# Patient Record
Sex: Female | Born: 2000 | Race: White | Hispanic: Yes | Marital: Single | Smoking: Never smoker
Health system: Southern US, Community
[De-identification: ages and names within clinical notes are randomized; demographics above are authoritative.]

## PROBLEM LIST (undated history)

## (undated) DIAGNOSIS — B001 Herpesviral vesicular dermatitis: Secondary | ICD-10-CM

## (undated) DIAGNOSIS — J309 Allergic rhinitis, unspecified: Secondary | ICD-10-CM

## (undated) DIAGNOSIS — L509 Urticaria, unspecified: Secondary | ICD-10-CM

## (undated) DIAGNOSIS — E739 Lactose intolerance, unspecified: Secondary | ICD-10-CM

## (undated) DIAGNOSIS — H5789 Other specified disorders of eye and adnexa: Secondary | ICD-10-CM

## (undated) HISTORY — DX: Other specified disorders of eye and adnexa: H57.89

## (undated) HISTORY — DX: Urticaria, unspecified: L50.9

## (undated) HISTORY — PX: UPPER GI ENDOSCOPY: SHX6162

## (undated) HISTORY — DX: Lactose intolerance, unspecified: E73.9

## (undated) HISTORY — DX: Herpesviral vesicular dermatitis: B00.1

## (undated) HISTORY — DX: Allergic rhinitis, unspecified: J30.9

---
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
## 2020-01-06 ENCOUNTER — Other Ambulatory Visit: Payer: Self-pay

## 2020-01-06 MED ORDER — VALACYCLOVIR HCL 500 MG PO TABS: ORAL_TABLET | ORAL | 4 refills | Status: DC

## 2020-01-06 NOTE — Telephone Encounter (Signed)
Patient's mom called because her valacyclovir refill was only for 15 days. Per prescription details I have change dto allow fo rthe full 30 days.

## 2020-04-27 ENCOUNTER — Ambulatory Visit (INDEPENDENT_AMBULATORY_CARE_PROVIDER_SITE_OTHER): Payer: Medicaid Other | Admitting: Allergy and Immunology

## 2020-04-27 ENCOUNTER — Other Ambulatory Visit: Payer: Self-pay

## 2020-04-27 ENCOUNTER — Encounter: Payer: Self-pay | Admitting: Allergy and Immunology

## 2020-04-27 VITALS — BP 108/72 | HR 88 | Resp 16 | Ht 64.0 in | Wt 144.0 lb

## 2020-04-27 DIAGNOSIS — B001 Herpesviral vesicular dermatitis: Secondary | ICD-10-CM

## 2020-04-27 DIAGNOSIS — J3089 Other allergic rhinitis: Secondary | ICD-10-CM

## 2020-04-27 DIAGNOSIS — L989 Disorder of the skin and subcutaneous tissue, unspecified: Secondary | ICD-10-CM

## 2020-04-27 MED ORDER — PIMECROLIMUS 1 % EX CREA: TOPICAL_CREAM | CUTANEOUS | 5 refills | Status: AC

## 2020-04-27 MED ORDER — FEXOFENADINE HCL 180 MG PO TABS: 180.0000 mg | ORAL_TABLET | Freq: Every day | ORAL | 11 refills | Status: DC | PRN

## 2020-04-27 MED ORDER — VALACYCLOVIR HCL 500 MG PO TABS: ORAL_TABLET | ORAL | 4 refills | Status: DC

## 2020-04-27 MED ORDER — FLUTICASONE PROPIONATE 50 MCG/ACT NA SUSP: NASAL | 5 refills | Status: AC

## 2020-04-27 NOTE — Patient Instructions (Signed)
  1. Continue Valtrex 500 one tablet 3-7 times per week  2. Continue Elidel applied to face 1-7 times per week if needed.    3. Continue Flonase 1 spray each nostril 1-7 times per week if needed  4. Continue OTC antihistamine if needed  5. Return to clinic 12 months or earlier if problem  6. Recommend COVID vaccination series and flu vaccine

## 2020-04-27 NOTE — Progress Notes (Signed)
[place] - [place] - [place] - [place]   Follow-up Note  Referring Provider: Lise Auer, MD Primary Provider: Lise Auer, MD Date of Office Visit: 04/27/2020  Subjective:   Sandy Gardner (DOB: 04-07-2001) is a 20 y.o. female who [facility] 04/27/2020 in re-evaluation of the following:  HPI: Sandy Gardner returns to this clinic in reevaluation and allergic rhinitis and a history of inflammatory dermatosis involving her face and a history of herpes labialis treated with suppressive Valtrex.  I have not seen her in this clinic since 11 April 2018 but she did visit with our nurse practitioner on 14 June 2019 at which point time she was doing very well regarding each issue.  She continues to do very well.  She has very little issues with her airway while intermittently using some Flonase a few times per week and an over-the-counter antihistamine.  Her Valtrex utilized on a daily basis works very well in preventing her from developing recurrent fever blisters.  She rarely applies any Elidel to her face but when she does it seems to work quite well regarding her inflammatory dermatosis.  She has not received the Covid vaccine or the flu vaccine this year.  She is attending [employer] and is a sophomore at this point.  Allergies as of 04/27/2020      Reactions   Omnicef [cefdinir] Hives      Medication List      ALIGN PO Take 1 tablet by mouth daily.   fexofenadine 180 MG tablet Commonly known as: ALLEGRA Take 1 tablet (180 mg total) by mouth daily as needed for allergies or rhinitis.   fluticasone 50 MCG/ACT nasal spray Commonly known as: Flonase 1 spray in each nostril 1-7 times weekly.   multivitamin tablet Take 1 tablet by mouth daily.   pimecrolimus 1 % cream Commonly known as: ELIDEL Apply to face 3-7 times weekly.   valACYclovir 500 MG tablet Commonly known as: VALTREX TAKE 1 TABLET BY MOUTH 3 TO 7  TIMES PER WEEK   Vitamin D-3 125 MCG (5000 UT) Tabs Take 1 tablet by mouth daily.       Past Medical History:  Diagnosis Date  . Allergic rhinitis   . Eyes swollen   . Lactose intolerance   . Urticaria     Past Surgical History:  Procedure Laterality Date  . GROWTH PLATE SURGERY Left 2005   elbow  . UPPER GI ENDOSCOPY      Review of systems negative except as noted in HPI / PMHx or noted below:  Review of Systems  Constitutional: Negative.   HENT: Negative.   Eyes: Negative.   Respiratory: Negative.   Cardiovascular: Negative.   Gastrointestinal: Negative.   Genitourinary: Negative.   Musculoskeletal: Negative.   Skin: Negative.   Neurological: Negative.   Endo/Heme/Allergies: Negative.   Psychiatric/Behavioral: Negative.      Objective:   Vitals:   04/27/20 1506  BP: 108/72  Pulse: 88  Resp: 16  SpO2: 98%   Height: 5\' 4"  (162.6 cm)  Weight: 144 lb (65.3 kg)   Physical Exam Constitutional:      Appearance: She is not diaphoretic.  HENT:     Head: Normocephalic.     Right Ear: Tympanic membrane, ear canal and external ear normal.     Left Ear: Tympanic membrane, ear canal and external ear normal.     Nose: Nose normal. No mucosal edema or rhinorrhea.  Mouth/Throat:     Mouth: Oropharynx is clear and moist and mucous membranes are normal.     Pharynx: Uvula midline. No oropharyngeal exudate.  Eyes:     Conjunctiva/sclera: Conjunctivae normal.  Neck:     Thyroid: No thyromegaly.     Trachea: Trachea normal. No tracheal tenderness or tracheal deviation.  Cardiovascular:     Rate and Rhythm: Normal rate and regular rhythm.     Heart sounds: Normal heart sounds, S1 normal and S2 normal. No murmur heard.   Pulmonary:     Effort: No respiratory distress.     Breath sounds: Normal breath sounds. No stridor. No wheezing or rales.  Musculoskeletal:        General: No edema.  Lymphadenopathy:     Head:     Right side of head: No tonsillar  adenopathy.     Left side of head: No tonsillar adenopathy.     Cervical: No cervical adenopathy.  Skin:    Findings: No erythema or rash.     Nails: There is no clubbing.  Neurological:     Mental Status: She is alert.     Diagnostics: none  Assessment and Plan:   1. Perennial allergic rhinitis   2. Inflammatory dermatosis   3. Herpes labialis     1. Continue Valtrex 500 one tablet 3-7 times per week  2. Continue Elidel applied to face 1-7 times per week if needed.    3. Continue Flonase 1 spray each nostril 1-7 times per week if needed  4. Continue OTC antihistamine if needed  5. Return to clinic 12 months or earlier if problem  6. Recommend COVID vaccination series and flu vaccine  Sandy Gardner appears to be doing very well regarding each of her issues while she is using medical therapy on a pretty consistent basis to address her allergic rhinitis and her inflammatory dermatosis and her herpes labialis.  At this point she will continue on this plan and I will see her back in this clinic in 1 year or earlier if there is a problem.  Laurette Schimke, MD Allergy / Immunology [facility]

## 2020-04-28 ENCOUNTER — Encounter: Payer: Self-pay | Admitting: Allergy and Immunology

## 2020-05-25 ENCOUNTER — Other Ambulatory Visit: Payer: Self-pay

## 2020-05-25 ENCOUNTER — Telehealth: Payer: Self-pay | Admitting: Allergy and Immunology

## 2020-05-25 MED ORDER — VALACYCLOVIR HCL 500 MG PO TABS: ORAL_TABLET | ORAL | 4 refills | Status: DC

## 2020-05-25 NOTE — Telephone Encounter (Signed)
Abrea is at college and would like Valtrex sent to [facility] at [id number] [place], [place] [address].

## 2020-05-25 NOTE — Telephone Encounter (Signed)
Refill sent in and patient was notified 

## 2020-10-15 ENCOUNTER — Other Ambulatory Visit: Payer: Self-pay | Admitting: Allergy and Immunology

## 2020-11-19 DIAGNOSIS — I471 Supraventricular tachycardia: Secondary | ICD-10-CM | POA: Diagnosis not present

## 2020-11-19 DIAGNOSIS — G909 Disorder of the autonomic nervous system, unspecified: Secondary | ICD-10-CM | POA: Diagnosis not present

## 2020-11-20 ENCOUNTER — Other Ambulatory Visit: Payer: Self-pay

## 2020-11-20 ENCOUNTER — Encounter: Payer: Self-pay | Admitting: Cardiology

## 2020-11-20 ENCOUNTER — Ambulatory Visit (INDEPENDENT_AMBULATORY_CARE_PROVIDER_SITE_OTHER): Payer: Medicaid Other | Admitting: Cardiology

## 2020-11-20 VITALS — BP 86/68 | HR 82 | Ht 64.0 in | Wt 145.0 lb

## 2020-11-20 DIAGNOSIS — G909 Disorder of the autonomic nervous system, unspecified: Secondary | ICD-10-CM

## 2020-11-20 DIAGNOSIS — I371 Nonrheumatic pulmonary valve insufficiency: Secondary | ICD-10-CM | POA: Diagnosis not present

## 2020-11-20 DIAGNOSIS — I471 Supraventricular tachycardia: Secondary | ICD-10-CM | POA: Diagnosis not present

## 2020-11-20 NOTE — H&P (View-Only) (Signed)
Electrophysiology Office Note:    Date:  11/20/2020   ID:  Sandy Gardner, DOB Jan 30, 2001, MRN [id number]  PCP:  Sandy Auer, MD  [facility] Cardiologist:  None  [facility] Electrophysiologist:  None   Referring MD: Sandy Auer, MD   Chief Complaint: SVT  History of Present Illness:    Sandy Gardner is a 20 y.o. female who presents for an evaluation of SVT at the request of Dr Sandy Gardner. She has no significant past medical history. Her father has a history of palpitations, tachycardia and syncope. Sandy Gardner is with her mother during today's appointment. She presented to [facility] ED after experienced sudden onset palpitations. She tells me she has had at least 5 episodes of these racing heart beats and lightheadedness. No specific triggers. She had previously gone to [facility] but symptoms spontaneously resolved.   She was seen by Dr Sandy Gardner at [facility]. A beta blocker was offered alongside midodrine but [name] was reluctant to start and would like to pursue a treatment option that avoids long term medications.  She is with her mother today in clinic.   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   She has previously been prescribed midodrine by her PCP for hypotension. She feels lightheaded and dizzy at times when her Bps are low. This seems to be worse since her Covid infection earlier this year.   Past Medical History:  Diagnosis Date   Allergic rhinitis    Eyes swollen    Lactose intolerance    Urticaria     Past Surgical History:  Procedure Laterality Date   GROWTH PLATE SURGERY Left 2005   elbow   UPPER GI ENDOSCOPY      Current Medications: Current Meds  Medication Sig   Cholecalciferol (VITAMIN D-3) 5000 units TABS Take 1 tablet by mouth daily.   fexofenadine (ALLEGRA) 180 MG tablet Take 1 tablet (180 mg total) by mouth daily as needed for allergies or rhinitis.   fluticasone (FLONASE) 50 MCG/ACT nasal spray 1 spray  in each nostril 1-7 times weekly.   Multiple Vitamin (MULTIVITAMIN) tablet Take 1 tablet by mouth daily.   pimecrolimus (ELIDEL) 1 % cream Apply to face 3-7 times weekly.   Probiotic Product (ALIGN PO) Take 1 tablet by mouth daily.   valACYclovir (VALTREX) 500 MG tablet TAKE 1 TABLET BY MOUTH 3 TO 7 TIMES PER WEEK     Allergies:   Omnicef [cefdinir]   Social History   Socioeconomic History   Marital status: Single    Spouse name: Not on file   Number of children: Not on file   Years of education: Not on file   Highest education level: Not on file  Occupational History   Not on file  Tobacco Use   Smoking status: Never   Smokeless tobacco: Never  Vaping Use   Vaping Use: Never used  Substance and Sexual Activity   Alcohol use: No   Drug use: No   Sexual activity: Not on file  Other Topics Concern   Not on file  Social History Narrative   Not on file   Social Determinants of Health   Financial Resource Strain: Not on file  Food Insecurity: Not on file  Transportation Needs: Not on file  Physical Activity: Not on file  Stress: Not on file  Social Connections: Not on file     Family History: The patient's family history includes Asthma in her mother; Food Allergy in  her mother; Hypertension in her father.  ROS:   Please see the history of present illness.    All other systems reviewed and are negative.  EKGs/Labs/Other Studies Reviewed:    The following studies were reviewed today:  ECG #1 from [facility] health Narrow complex tachycardia with a ventricular rate of 250 bpm. There are retrograde P waves visible.   ECG # 2 from [facility] Sinus rhythm without evidence of preexcitation. No PVC. Intervals are normal.   EKG:  The ekg ordered today demonstrates normal sinus rhythm.  Recent Labs: No results found for requested labs within last 8760 hours.  Recent Lipid Panel No results found for: CHOL, TRIG, HDL, CHOLHDL, VLDL, LDLCALC, LDLDIRECT  Physical Exam:     VS:  BP (!) 86/68   Pulse 82   Ht 5\' 4"  (1.626 m)   Wt 145 lb (65.8 kg)   SpO2 98%   BMI 24.89 kg/m     Wt Readings from Last 3 Encounters:  11/20/20 145 lb (65.8 kg) (75 %, Z= 0.67)*  04/27/20 144 lb (65.3 kg) (75 %, Z= 0.69)*  04/11/18 153 lb 12.8 oz (69.8 kg) (88 %, Z= 1.16)*   * Growth percentiles are based on CDC (Girls, 2-20 Years) data.     GEN:  Well nourished, well developed in no acute distress HEENT: Normal NECK: No JVD; No carotid bruits LYMPHATICS: No lymphadenopathy CARDIAC: RRR, no murmurs, rubs, gallops RESPIRATORY:  Clear to auscultation without rales, wheezing or rhonchi  ABDOMEN: Soft, non-tender, non-distended MUSCULOSKELETAL:  No edema; No deformity  SKIN: Warm and dry NEUROLOGIC:  Alert and oriented x 3 PSYCHIATRIC:  Normal affect   ASSESSMENT:    1. SVT (supraventricular tachycardia) (HCC)    PLAN:    In order of problems listed above:  #SVT Narrow complex tachycardia.  Echo at [place] with normal EF. ECG during SVT suggests AVNRT although I cannot completely exclude AVRT or other SVT mechanisms. I discussed the treatment options with [name] and her mother during today's appointment. We could continue with a watchful waiting approach although I do not think is the best idea given she has experienced 5 episodes that are hemodynamically significant. Option 2 would be to use antiarrhythmic therapy but I also don't think this is a good idea given her relative hypotension and young age. Option 3 would be to pursue EP study and ablation. Given the possibility to achieve durable success without needing medications, I would favor this approach. [name] and her mother are both in agreement.  I discussed the risks, efficacy of EP study and ablation and she wishes to proceed.  I will plan to repeat the echo given image quality was limited at [facility].  Therapeutic strategies for supraventricular tachycardia including medicine and ablation were discussed  in detail with the patient today. Risk, benefits, and alternatives to EP study and radiofrequency ablation were also discussed in detail today. These risks include but are not limited to stroke, bleeding, vascular damage, tamponade, perforation, damage to the heart and other structures, AV block requiring pacemaker, worsening renal function, and death. The patient understands these risk and wishes to proceed.  We will therefore proceed with catheter ablation at the next available time.        Total time spent with patient today 65 minutes. This includes reviewing records, evaluating the patient and coordinating care.  Medication Adjustments/Labs and Tests Ordered: Current medicines are reviewed at length with the patient today.  Concerns regarding medicines are outlined above.  Orders Placed  This Encounter  Procedures   EKG 12-Lead   ECHOCARDIOGRAM COMPLETE   No orders of the defined types were placed in this encounter.    Signed, Rossie Muskrat. Lalla Brothers, MD, [facility], [facility] 11/20/2020 7:59 PM    Electrophysiology [facility]

## 2020-11-20 NOTE — Progress Notes (Signed)
Electrophysiology Office Note:    Date:  11/20/2020   ID:  Sandy Gardner, DOB Jan 30, 2001, MRN [id number]  PCP:  Lise Auer, MD  [facility] Cardiologist:  None  [facility] Electrophysiologist:  None   Referring MD: Lise Auer, MD   Chief Complaint: SVT  History of Present Illness:    Sandy Gardner is a 20 y.o. female who presents for an evaluation of SVT at the request of Dr Dulce Sellar. She has no significant past medical history. Her father has a history of palpitations, tachycardia and syncope. Sandy Gardner is with her mother during today's appointment. She presented to [facility] ED after experienced sudden onset palpitations. She tells me she has had at least 5 episodes of these racing heart beats and lightheadedness. No specific triggers. She had previously gone to [facility] but symptoms spontaneously resolved.   She was seen by Dr Dulce Sellar at [facility]. A beta blocker was offered alongside midodrine but Sandy Gardner was reluctant to start and would like to pursue a treatment option that avoids long term medications.  She is with her mother today in clinic.   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   She has previously been prescribed midodrine by her PCP for hypotension. She feels lightheaded and dizzy at times when her Bps are low. This seems to be worse since her Covid infection earlier this year.   Past Medical History:  Diagnosis Date   Allergic rhinitis    Eyes swollen    Lactose intolerance    Urticaria     Past Surgical History:  Procedure Laterality Date   GROWTH PLATE SURGERY Left 2005   elbow   UPPER GI ENDOSCOPY      Current Medications: Current Meds  Medication Sig   Cholecalciferol (VITAMIN D-3) 5000 units TABS Take 1 tablet by mouth daily.   fexofenadine (ALLEGRA) 180 MG tablet Take 1 tablet (180 mg total) by mouth daily as needed for allergies or rhinitis.   fluticasone (FLONASE) 50 MCG/ACT nasal spray 1 spray  in each nostril 1-7 times weekly.   Multiple Vitamin (MULTIVITAMIN) tablet Take 1 tablet by mouth daily.   pimecrolimus (ELIDEL) 1 % cream Apply to face 3-7 times weekly.   Probiotic Product (ALIGN PO) Take 1 tablet by mouth daily.   valACYclovir (VALTREX) 500 MG tablet TAKE 1 TABLET BY MOUTH 3 TO 7 TIMES PER WEEK     Allergies:   Omnicef [cefdinir]   Social History   Socioeconomic History   Marital status: Single    Spouse name: Not on file   Number of children: Not on file   Years of education: Not on file   Highest education level: Not on file  Occupational History   Not on file  Tobacco Use   Smoking status: Never   Smokeless tobacco: Never  Vaping Use   Vaping Use: Never used  Substance and Sexual Activity   Alcohol use: No   Drug use: No   Sexual activity: Not on file  Other Topics Concern   Not on file  Social History Narrative   Not on file   Social Determinants of Health   Financial Resource Strain: Not on file  Food Insecurity: Not on file  Transportation Needs: Not on file  Physical Activity: Not on file  Stress: Not on file  Social Connections: Not on file     Family History: The patient's family history includes Asthma in her mother; Food Allergy in  her mother; Hypertension in her father.  ROS:   Please see the history of present illness.    All other systems reviewed and are negative.  EKGs/Labs/Other Studies Reviewed:    The following studies were reviewed today:  ECG #1 from [facility] health Narrow complex tachycardia with a ventricular rate of 250 bpm. There are retrograde P waves visible.   ECG # 2 from [facility] Sinus rhythm without evidence of preexcitation. No PVC. Intervals are normal.   EKG:  The ekg ordered today demonstrates normal sinus rhythm.  Recent Labs: No results found for requested labs within last 8760 hours.  Recent Lipid Panel No results found for: CHOL, TRIG, HDL, CHOLHDL, VLDL, LDLCALC, LDLDIRECT  Physical Exam:     VS:  BP (!) 86/68   Pulse 82   Ht 5\' 4"  (1.626 m)   Wt 145 lb (65.8 kg)   SpO2 98%   BMI 24.89 kg/m     Wt Readings from Last 3 Encounters:  11/20/20 145 lb (65.8 kg) (75 %, Z= 0.67)*  04/27/20 144 lb (65.3 kg) (75 %, Z= 0.69)*  04/11/18 153 lb 12.8 oz (69.8 kg) (88 %, Z= 1.16)*   * Growth percentiles are based on CDC (Girls, 2-20 Years) data.     GEN:  Well nourished, well developed in no acute distress HEENT: Normal NECK: No JVD; No carotid bruits LYMPHATICS: No lymphadenopathy CARDIAC: RRR, no murmurs, rubs, gallops RESPIRATORY:  Clear to auscultation without rales, wheezing or rhonchi  ABDOMEN: Soft, non-tender, non-distended MUSCULOSKELETAL:  No edema; No deformity  SKIN: Warm and dry NEUROLOGIC:  Alert and oriented x 3 PSYCHIATRIC:  Normal affect   ASSESSMENT:    1. SVT (supraventricular tachycardia) (HCC)    PLAN:    In order of problems listed above:  #SVT Narrow complex tachycardia.  Echo at [place] with normal EF. ECG during SVT suggests AVNRT although I cannot completely exclude AVRT or other SVT mechanisms. I discussed the treatment options with Sandy Gardner and her mother during today's appointment. We could continue with a watchful waiting approach although I do not think is the best idea given she has experienced 5 episodes that are hemodynamically significant. Option 2 would be to use antiarrhythmic therapy but I also don't think this is a good idea given her relative hypotension and young age. Option 3 would be to pursue EP study and ablation. Given the possibility to achieve durable success without needing medications, I would favor this approach. Sandy Gardner and her mother are both in agreement.  I discussed the risks, efficacy of EP study and ablation and she wishes to proceed.  I will plan to repeat the echo given image quality was limited at [place].  Therapeutic strategies for supraventricular tachycardia including medicine and ablation were discussed  in detail with the patient today. Risk, benefits, and alternatives to EP study and radiofrequency ablation were also discussed in detail today. These risks include but are not limited to stroke, bleeding, vascular damage, tamponade, perforation, damage to the heart and other structures, AV block requiring pacemaker, worsening renal function, and death. The patient understands these risk and wishes to proceed.  We will therefore proceed with catheter ablation at the next available time.        Total time spent with patient today 65 minutes. This includes reviewing records, evaluating the patient and coordinating care.  Medication Adjustments/Labs and Tests Ordered: Current medicines are reviewed at length with the patient today.  Concerns regarding medicines are outlined above.  Orders Placed  This Encounter  Procedures   EKG 12-Lead   ECHOCARDIOGRAM COMPLETE   No orders of the defined types were placed in this encounter.    Signed, Rossie Muskrat. Lalla Brothers, MD, [facility], [facility] 11/20/2020 7:59 PM    Electrophysiology [facility]

## 2020-11-20 NOTE — Patient Instructions (Addendum)
Medication Instructions:  Your physician recommends that you continue on your current medications as directed. Please refer to the Current Medication list given to you today. *If you need a refill on your cardiac medications before your next appointment, please call your pharmacy*  Lab Work: None ordered. If you have labs (blood work) drawn today and your tests are completely normal, you will receive your results only by: MyChart Message (if you have MyChart) OR A paper copy in the mail If you have any lab test that is abnormal or we need to change your treatment, we will call you to review the results.  Testing/Procedures: Your physician has requested that you have an echocardiogram. Echocardiography is a painless test that uses sound waves to create images of your heart. It provides your doctor with information about the size and shape of your heart and how well your heart's chambers and valves are working. This procedure takes approximately one hour. There are no restrictions for this procedure.  Please schedule for ECHO- 11/23/2020 AT 11:00 AM  Follow-Up: At [facility], you and your health needs are our priority.  As part of our continuing mission to provide you with exceptional heart care, we have created designated Provider Care Teams.  These Care Teams include your primary Cardiologist (physician) and Advanced Practice Providers (APPs -  Physician Assistants and Nurse Practitioners) who all work together to provide you with the care you need, when you need it.  SEE INSTRUCTION LETTER  Cardiac electrophysiology: From cell to bedside (7th ed., pp. 8841-6606). [place], [place]: Elsevier.">  Cardiac Ablation Cardiac ablation is a procedure to destroy, or ablate, a small amount of heart tissue in very specific places. The heart has many electrical connections. Sometimes these connections are abnormal and can cause the heart to beat very fast or irregularly. Ablating some of the areas that  cause problems can improve the heart's rhythm or return it to normal. Ablation may be done for people who: Have Wolff-Parkinson-White syndrome. Have fast heart rhythms (tachycardia). Have taken medicines for an abnormal heart rhythm (arrhythmia) that were not effective or caused side effects. Have a high-risk heartbeat that may be life-threatening. During the procedure, a small incision is made in the neck or the groin, and a long, thin tube (catheter) is inserted into the incision and moved to the heart. Small devices (electrodes) on the tip of the catheter will send out electrical currents. A type of X-ray (fluoroscopy) will be used to help guide the catheter and to provide images of the heart. Tell a health care provider about: Any allergies you have. All medicines you are taking, including vitamins, herbs, eye drops, creams, and over-the-counter medicines. Any problems you or family members have had with anesthetic medicines. Any blood disorders you have. Any surgeries you have had. Any medical conditions you have, such as kidney failure. Whether you are pregnant or may be pregnant. What are the risks? Generally, this is a safe procedure. However, problems may occur, including: Infection. Bruising and bleeding at the catheter insertion site. Bleeding into the chest, especially into the sac that surrounds the heart. This is a serious complication. Stroke or blood clots. Damage to nearby structures or organs. Allergic reaction to medicines or dyes. Need for a permanent pacemaker if the normal electrical system is damaged. A pacemaker is a small computer that sends electrical signals to the heart and helps your heart beat normally. The procedure not being fully effective. This may not be recognized until months later. Repeat ablation  procedures are sometimes done. What happens before the procedure? Medicines Ask your health care provider about: Changing or stopping your regular medicines.  This is especially important if you are taking diabetes medicines or blood thinners. Taking medicines such as aspirin and ibuprofen. These medicines can thin your blood. Do not take these medicines unless your health care provider tells you to take them. Taking over-the-counter medicines, vitamins, herbs, and supplements. General instructions Follow instructions from your health care provider about eating or drinking restrictions. Plan to have someone take you home from the hospital or clinic. If you will be going home right after the procedure, plan to have someone with you for 24 hours. Ask your health care provider what steps will be taken to prevent infection. What happens during the procedure?  An IV will be inserted into one of your veins. You will be given a medicine to help you relax (sedative). The skin on your neck or groin will be numbed. An incision will be made in your neck or your groin. A needle will be inserted through the incision and into a large vein in your neck or groin. A catheter will be inserted into the needle and moved to your heart. Dye may be injected through the catheter to help your surgeon see the area of the heart that needs treatment. Electrical currents will be sent from the catheter to ablate heart tissue in desired areas. There are three types of energy that may be used to do this: Heat (radiofrequency energy). Laser energy. Extreme cold (cryoablation). When the tissue has been ablated, the catheter will be removed. Pressure will be held on the insertion area to prevent a lot of bleeding. A bandage (dressing) will be placed over the insertion area. The exact procedure may vary among health care providers and hospitals. What happens after the procedure? Your blood pressure, heart rate, breathing rate, and blood oxygen level will be monitored until you leave the hospital or clinic. Your insertion area will be monitored for bleeding. You will need to lie  still for a few hours to ensure that you do not bleed from the insertion area. Do not drive for 24 hours or as long as told by your health care provider. Summary Cardiac ablation is a procedure to destroy, or ablate, a small amount of heart tissue using an electrical current. This procedure can improve the heart rhythm or return it to normal. Tell your health care provider about any medical conditions you may have and all medicines you are taking to treat them. This is a safe procedure, but problems may occur. Problems may include infection, bruising, damage to nearby organs or structures, or allergic reactions to medicines. Follow your health care provider's instructions about eating and drinking before the procedure. You may also be told to change or stop some of your medicines. After the procedure, do not drive for 24 hours or as long as told by your health care provider. This information is not intended to replace advice given to you by your health care provider. Make sure you discuss any questions you have with your healthcare provider. Document Revised: 02/18/2019 Document Reviewed: 02/18/2019 Elsevier Patient Education  2022 [employer].

## 2020-11-23 ENCOUNTER — Other Ambulatory Visit: Payer: Self-pay

## 2020-11-23 ENCOUNTER — Ambulatory Visit (HOSPITAL_COMMUNITY): Payer: Medicaid Other | Attending: Internal Medicine

## 2020-11-23 DIAGNOSIS — I471 Supraventricular tachycardia: Secondary | ICD-10-CM | POA: Diagnosis present

## 2020-11-23 LAB — ECHOCARDIOGRAM COMPLETE
Area-P 1/2: 2.62 cm2
S' Lateral: 2.8 cm

## 2020-11-25 NOTE — Anesthesia Preprocedure Evaluation (Addendum)
Anesthesia Evaluation  Patient identified by MRN, date of birth, ID band Patient awake    Reviewed: Allergy & Precautions, NPO status , Patient's Chart, lab work & pertinent test results  Airway Mallampati: II  TM Distance: >3 FB Neck ROM: Full    Dental  (+) Teeth Intact   Pulmonary neg pulmonary ROS,    Pulmonary exam normal        Cardiovascular + dysrhythmias Supra Ventricular Tachycardia  Rhythm:Regular Rate:Normal     Neuro/Psych negative neurological ROS  negative psych ROS   GI/Hepatic negative GI ROS, Neg liver ROS,   Endo/Other  negative endocrine ROS  Renal/GU negative Renal ROS  negative genitourinary   Musculoskeletal negative musculoskeletal ROS (+)   Abdominal (+)  Abdomen: soft. Bowel sounds: normal.  Peds  Hematology negative hematology ROS (+)   Anesthesia Other Findings   Reproductive/Obstetrics                            Anesthesia Physical Anesthesia Plan  ASA: 2  Anesthesia Plan: MAC   Post-op Pain Management:    Induction: Intravenous  PONV Risk Score and Plan: 2 and Ondansetron, Dexamethasone, Treatment may vary due to age or medical condition, Propofol infusion and Midazolam  Airway Management Planned: Simple Face Mask, Natural Airway and Nasal Cannula  Additional Equipment: None  Intra-op Plan:   Post-operative Plan: Extubation in OR  Informed Consent: I have reviewed the patients History and Physical, chart, labs and discussed the procedure including the risks, benefits and alternatives for the proposed anesthesia with the patient or authorized representative who has indicated his/her understanding and acceptance.     Dental advisory given  Plan Discussed with: CRNA  Anesthesia Plan Comments: (Lab Results      Component                Value               Date                      PREGTESTUR               NEGATIVE            11/26/2020            Lab Results      Component                Value               Date                      WBC                      5.7                 11/26/2020                HGB                      13.2                11/26/2020                HCT                      39.8  11/26/2020                MCV                      104.7 (H)           11/26/2020                PLT                      177                 11/26/2020           Lab Results      Component                Value               Date                      NA                       135                 11/26/2020                K                        3.3 (L)             11/26/2020                CO2                      23                  11/26/2020                GLUCOSE                  92                  11/26/2020                BUN                      12                  11/26/2020                CREATININE               0.73                11/26/2020                CALCIUM                  9.3                 11/26/2020                GFRNONAA                 >60                 11/26/2020                GFRAA  CANCELED            10/21/2016           ECHO 11/23/20: IMPRESSIONS  1. Left ventricular ejection fraction, by estimation, is 60 to 65%. Left  ventricular ejection fraction by 3D volume is 58 %. The left ventricle has  normal function. The left ventricle has no regional wall motion  abnormalities. Left ventricular diastolic  parameters were normal.  2. Right ventricular systolic function is normal. The right ventricular  size is normal.  3. The mitral valve is normal in structure. No evidence of mitral valve  regurgitation. No evidence of mitral stenosis.  4. The aortic valve is tricuspid. Aortic valve regurgitation is not  visualized. No aortic stenosis is present. )     Anesthesia Quick Evaluation

## 2020-11-26 ENCOUNTER — Ambulatory Visit (HOSPITAL_COMMUNITY)
Admission: RE | Admit: 2020-11-26 | Discharge: 2020-11-26 | Disposition: A | Discharge status: Home / Self Care | Payer: Medicaid Other | Attending: Cardiology | Admitting: Cardiology

## 2020-11-26 ENCOUNTER — Encounter (HOSPITAL_COMMUNITY): Payer: Self-pay | Admitting: Cardiology

## 2020-11-26 ENCOUNTER — Other Ambulatory Visit: Payer: Self-pay

## 2020-11-26 ENCOUNTER — Ambulatory Visit (HOSPITAL_COMMUNITY): Payer: Medicaid Other | Admitting: Anesthesiology

## 2020-11-26 ENCOUNTER — Encounter (HOSPITAL_COMMUNITY)
Admission: RE | Disposition: A | Discharge status: Home / Self Care | Payer: Self-pay | Source: Home / Self Care | Attending: Cardiology

## 2020-11-26 DIAGNOSIS — Z8616 Personal history of COVID-19: Secondary | ICD-10-CM | POA: Diagnosis not present

## 2020-11-26 DIAGNOSIS — Z881 Allergy status to other antibiotic agents status: Secondary | ICD-10-CM | POA: Diagnosis not present

## 2020-11-26 DIAGNOSIS — Z8249 Family history of ischemic heart disease and other diseases of the circulatory system: Secondary | ICD-10-CM | POA: Insufficient documentation

## 2020-11-26 DIAGNOSIS — I471 Supraventricular tachycardia: Secondary | ICD-10-CM | POA: Insufficient documentation

## 2020-11-26 HISTORY — PX: SVT ABLATION: EP1225

## 2020-11-26 LAB — CBC
HCT: 39.8 % (ref 36.0–46.0)
Hemoglobin: 13.2 g/dL (ref 12.0–15.0)
MCH: 34.7 pg — ABNORMAL HIGH (ref 26.0–34.0)
MCHC: 33.2 g/dL (ref 30.0–36.0)
MCV: 104.7 fL — ABNORMAL HIGH (ref 80.0–100.0)
Platelets: 177 10*3/uL (ref 150–400)
RBC: 3.8 MIL/uL — ABNORMAL LOW (ref 3.87–5.11)
RDW: 11.7 % (ref 11.5–15.5)
WBC: 5.7 10*3/uL (ref 4.0–10.5)
nRBC: 0 % (ref 0.0–0.2)

## 2020-11-26 LAB — BASIC METABOLIC PANEL
Anion gap: 7 (ref 5–15)
BUN: 12 mg/dL (ref 6–20)
CO2: 23 mmol/L (ref 22–32)
Calcium: 9.3 mg/dL (ref 8.9–10.3)
Chloride: 105 mmol/L (ref 98–111)
Creatinine, Ser: 0.73 mg/dL (ref 0.44–1.00)
GFR, Estimated: >60 mL/min (ref >60)
Glucose, Bld: 92 mg/dL (ref 70–99)
Potassium: 3.3 mmol/L — ABNORMAL LOW (ref 3.5–5.1)
Sodium: 135 mmol/L (ref 135–145)

## 2020-11-26 LAB — PREGNANCY, URINE: Preg Test, Ur: NEGATIVE

## 2020-11-26 SURGERY — SVT ABLATION
Anesthesia: Monitor Anesthesia Care

## 2020-11-26 MED ORDER — ONDANSETRON HCL 4 MG/2ML IJ SOLN: 4.0000 mg | Freq: Four times a day (QID) | INTRAMUSCULAR | Status: DC | PRN

## 2020-11-26 MED ORDER — SODIUM CHLORIDE 0.9% FLUSH: 3.0000 mL | INTRAVENOUS | Status: DC | PRN

## 2020-11-26 MED ORDER — PHENYLEPHRINE HCL-NACL 20-0.9 MG/250ML-% IV SOLN
INTRAVENOUS | Status: DC | PRN
  Administered 2020-11-26: 25 ug/min via INTRAVENOUS

## 2020-11-26 MED ORDER — FENTANYL CITRATE (PF) 100 MCG/2ML IJ SOLN
INTRAMUSCULAR | Status: DC | PRN
  Administered 2020-11-26 (×3): 25 ug via INTRAVENOUS

## 2020-11-26 MED ORDER — PROPOFOL 500 MG/50ML IV EMUL
INTRAVENOUS | Status: DC | PRN
  Administered 2020-11-26: 125 ug/kg/min via INTRAVENOUS
  Administered 2020-11-26: 75 ug/kg/min via INTRAVENOUS

## 2020-11-26 MED ORDER — SODIUM CHLORIDE 0.9 % IV SOLN: 250.0000 mL | INTRAVENOUS | Status: DC | PRN

## 2020-11-26 MED ORDER — DEXAMETHASONE SODIUM PHOSPHATE 10 MG/ML IJ SOLN
INTRAMUSCULAR | Status: DC | PRN
  Administered 2020-11-26: 5 mg via INTRAVENOUS

## 2020-11-26 MED ORDER — ONDANSETRON HCL 4 MG/2ML IJ SOLN
INTRAMUSCULAR | Status: DC | PRN
  Administered 2020-11-26: 4 mg via INTRAVENOUS

## 2020-11-26 MED ORDER — ACETAMINOPHEN 325 MG PO TABS
650.0000 mg | ORAL_TABLET | ORAL | Status: DC | PRN
Start: 2020-11-26 — End: 2020-11-26
  Filled 2020-11-26: qty 2

## 2020-11-26 MED ORDER — MIDAZOLAM HCL 5 MG/5ML IJ SOLN
INTRAMUSCULAR | Status: DC | PRN
  Administered 2020-11-26: 2 mg via INTRAVENOUS

## 2020-11-26 MED ORDER — HEPARIN (PORCINE) IN NACL 1000-0.9 UT/500ML-% IV SOLN
INTRAVENOUS | Status: DC | PRN
  Administered 2020-11-26 (×2): 500 mL

## 2020-11-26 MED ORDER — ISOPROTERENOL HCL 0.2 MG/ML IJ SOLN
INTRAVENOUS | Status: DC | PRN
  Administered 2020-11-26: 2 ug/min via INTRAVENOUS

## 2020-11-26 MED ORDER — SODIUM CHLORIDE 0.9 % IV SOLN: INTRAVENOUS | Status: DC

## 2020-11-26 MED ORDER — BUPIVACAINE HCL (PF) 0.25 % IJ SOLN
INTRAMUSCULAR | Status: DC | PRN
  Administered 2020-11-26: 20 mL

## 2020-11-26 MED ORDER — PHENYLEPHRINE 40 MCG/ML (10ML) SYRINGE FOR IV PUSH (FOR BLOOD PRESSURE SUPPORT)
PREFILLED_SYRINGE | INTRAVENOUS | Status: DC | PRN
  Administered 2020-11-26: 40 ug via INTRAVENOUS
  Administered 2020-11-26: 80 ug via INTRAVENOUS

## 2020-11-26 MED ORDER — SODIUM CHLORIDE 0.9% FLUSH: 3.0000 mL | Freq: Two times a day (BID) | INTRAVENOUS | Status: DC

## 2020-11-26 MED ORDER — HEPARIN SODIUM (PORCINE) 1000 UNIT/ML IJ SOLN
INTRAMUSCULAR | Status: DC | PRN
  Administered 2020-11-26: 1000 [IU] via INTRAVENOUS

## 2020-11-26 MED ORDER — HEPARIN (PORCINE) IN NACL 1000-0.9 UT/500ML-% IV SOLN
INTRAVENOUS | Status: DC | PRN
  Administered 2020-11-26: 500 mL

## 2020-11-26 SURGICAL SUPPLY — 15 items
CATH CRD2 QUAD 6FR 5 (CATHETERS) ×1 IMPLANT
CATH DECANAV F CURVE (CATHETERS) ×1 IMPLANT
CATH JOSEPH QUAD ALLRED 6F REP (CATHETERS) ×1 IMPLANT
CATH SMTCH THERMOCOOL SF DF (CATHETERS) ×1 IMPLANT
CLOSURE PERCLOSE PROSTYLE (VASCULAR PRODUCTS) ×3 IMPLANT
PACK EP LATEX FREE (CUSTOM PROCEDURE TRAY) ×2
PACK EP LF (CUSTOM PROCEDURE TRAY) ×1 IMPLANT
PAD PRO RADIOLUCENT 2001M-C (PAD) ×2 IMPLANT
PATCH CARTO3 (PAD) ×1 IMPLANT
SHEATH CARTO VIZIGO SM CVD (SHEATH) ×1 IMPLANT
SHEATH PINNACLE 7F 10CM (SHEATH) ×1 IMPLANT
SHEATH PINNACLE 8F 10CM (SHEATH) ×2 IMPLANT
SHEATH PINNACLE 9F 10CM (SHEATH) ×1 IMPLANT
SHEATH PROBE COVER 6X72 (BAG) ×1 IMPLANT
TUBING SMART ABLATE COOLFLOW (TUBING) ×1 IMPLANT

## 2020-11-26 NOTE — Discharge Instructions (Addendum)
Post procedure care instructions No driving for 4 days. No lifting over 5 lbs for 1 week. No vigorous or sexual activity for 1 week. You may return to work/your usual activities on 12/04/20. Keep procedure site clean & dry. If you notice increased pain, swelling, bleeding or pus, call/return!  You may shower after 24 hours, but no soaking in baths/hot tubs/pools for 1 week.      Cardiac Ablation, Care After  This sheet gives you information about how to care for yourself after your procedure. Your health care provider may also give you more specific instructions. If you have problems or questions, contact your health care provider. What can I expect after the procedure? After the procedure, it is common to have: Bruising around your puncture site. Tenderness around your puncture site. Skipped heartbeats. Tiredness (fatigue).  Follow these instructions at home: Puncture site care  Follow instructions from your health care provider about how to take care of your puncture site. Make sure you: If present, leave stitches (sutures), skin glue, or adhesive strips in place. These skin closures may need to stay in place for up to 2 weeks. If adhesive strip edges start to loosen and curl up, you may trim the loose edges. Do not remove adhesive strips completely unless your health care provider tells you to do that. If a large square bandage is present, this may be removed 24 hours after surgery.  Check your puncture site every day for signs of infection. Check for: Redness, swelling, or pain. Fluid or blood. If your puncture site starts to bleed, lie down on your back, apply firm pressure to the area, and contact your health care provider. Warmth. Pus or a bad smell. Driving Do not drive for at least 4 days after your procedure or however long your health care provider recommends. (Do not resume driving if you have previously been instructed not to drive for other health reasons.) Do not drive or  use heavy machinery while taking prescription pain medicine. Activity Avoid activities that take a lot of effort for at least 7 days after your procedure. Do not lift anything that is heavier than 5 lb (4.5 kg) for one week.  No sexual activity for 1 week.  Return to your normal activities as told by your health care provider. Ask your health care provider what activities are safe for you. General instructions Take over-the-counter and prescription medicines only as told by your health care provider. Do not use any products that contain nicotine or tobacco, such as cigarettes and e-cigarettes. If you need help quitting, ask your health care provider. You may shower after 24 hours, but Do not take baths, swim, or use a hot tub for 1 week.  Do not drink alcohol for 24 hours after your procedure. Keep all follow-up visits as told by your health care provider. This is important. Contact a health care provider if: You have redness, mild swelling, or pain around your puncture site. You have fluid or blood coming from your puncture site that stops after applying firm pressure to the area. Your puncture site feels warm to the touch. You have pus or a bad smell coming from your puncture site. You have a fever. You have chest pain or discomfort that spreads to your neck, jaw, or arm. You are sweating a lot. You feel nauseous. You have a fast or irregular heartbeat. You have shortness of breath. You are dizzy or light-headed and feel the need to lie down. You have pain  or numbness in the arm or leg closest to your puncture site. Get help right away if: Your puncture site suddenly swells. Your puncture site is bleeding and the bleeding does not stop after applying firm pressure to the area. These symptoms may represent a serious problem that is an emergency. Do not wait to see if the symptoms will go away. Get medical help right away. Call your local emergency services (911 in the [place] Do not drive  yourself to the hospital. Summary After the procedure, it is normal to have bruising and tenderness at the puncture site in your groin, neck, or forearm. Check your puncture site every day for signs of infection. Get help right away if your puncture site is bleeding and the bleeding does not stop after applying firm pressure to the area. This is a medical emergency. This information is not intended to replace advice given to you by your health care provider. Make sure you discuss any questions you have with your health care provider.

## 2020-11-26 NOTE — Transfer of Care (Signed)
Immediate Anesthesia Transfer of Care Note  Patient: Sandy Gardner  Procedure(s) Performed: SVT ABLATION  Patient Location: Cath Lab  Anesthesia Type:MAC  Level of Consciousness: awake, alert  and oriented  Airway & Oxygen Therapy: Patient Spontanous Breathing  Post-op Assessment: Report given to RN, Post -op Vital signs reviewed and stable and Patient moving all extremities X 4  Post vital signs: Reviewed and stable  Last Vitals:  Vitals Value Taken Time  BP 100/55 11/26/20 1034  Temp    Pulse 84 11/26/20 1035  Resp 16 11/26/20 1035  SpO2 99 % 11/26/20 1035  Vitals shown include unvalidated device data.  Last Pain:  Vitals:   11/26/20 0559  TempSrc:   PainSc: 0-No pain         Complications: No notable events documented.

## 2020-11-26 NOTE — Interval H&P Note (Signed)
History and Physical Interval Note:  11/26/2020 7:18 AM  Sandy Gardner  has presented today for surgery, with the diagnosis of svt.  The various methods of treatment have been discussed with the patient and family. After consideration of risks, benefits and other options for treatment, the patient has consented to  Procedure(s): SVT ABLATION (N/A) as a surgical intervention.  The patient's history has been reviewed, patient examined, no change in status, stable for surgery.  I have reviewed the patient's chart and labs.  Questions were answered to the patient's satisfaction.     Malaia Buchta T Arman Loy

## 2020-11-26 NOTE — Anesthesia Procedure Notes (Signed)
Procedure Name: MAC Date/Time: 11/26/2020 7:35 AM Performed by: Harden Mo, CRNA Pre-anesthesia Checklist: Patient identified, Emergency Drugs available, Suction available and Patient being monitored Patient Re-evaluated:Patient Re-evaluated prior to induction Oxygen Delivery Method: Simple face mask Preoxygenation: Pre-oxygenation with 100% oxygen Induction Type: IV induction Placement Confirmation: positive ETCO2 and breath sounds checked- equal and bilateral Dental Injury: Teeth and Oropharynx as per pre-operative assessment

## 2020-11-26 NOTE — Anesthesia Postprocedure Evaluation (Signed)
Anesthesia Post Note  Patient: Sandy Gardner  Procedure(s) Performed: SVT ABLATION     Patient location during evaluation: PACU Anesthesia Type: MAC Level of consciousness: awake and alert Pain management: pain level controlled Vital Signs Assessment: post-procedure vital signs reviewed and stable Respiratory status: spontaneous breathing, nonlabored ventilation, respiratory function stable and patient connected to nasal cannula oxygen Cardiovascular status: stable and blood pressure returned to baseline Postop Assessment: no apparent nausea or vomiting Anesthetic complications: no   No notable events documented.  Last Vitals:  Vitals:   11/26/20 1330 11/26/20 1400  BP: (!) 96/52 (!) 100/59  Pulse: 91 87  Resp: 18 17  Temp:    SpO2: 98% 99%    Last Pain:  Vitals:   11/26/20 1120  TempSrc:   PainSc: 0-No pain                 Belenda Cruise P Corvin Sorbo

## 2020-12-22 ENCOUNTER — Other Ambulatory Visit: Payer: Self-pay

## 2020-12-22 ENCOUNTER — Ambulatory Visit (INDEPENDENT_AMBULATORY_CARE_PROVIDER_SITE_OTHER): Payer: Medicaid Other | Admitting: Cardiology

## 2020-12-22 ENCOUNTER — Encounter: Payer: Self-pay | Admitting: Cardiology

## 2020-12-22 VITALS — BP 110/64 | HR 81 | Ht 64.0 in | Wt 146.0 lb

## 2020-12-22 DIAGNOSIS — I471 Supraventricular tachycardia: Secondary | ICD-10-CM | POA: Diagnosis not present

## 2020-12-22 NOTE — Progress Notes (Signed)
Electrophysiology Office Follow up Visit Note:    Date:  12/22/2020   ID:  Sandy Gardner, DOB [birth year], MRN [id number]  PCP:  Lise Auer, MD   [facility] Electrophysiologist:  Lanier Prude, MD    Interval History:    Sandy Gardner is a 20 y.o. 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  She is done well since the ablation procedure.  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  She has had no recurrent arrhythmias.    Past Medical History:  Diagnosis Date   Allergic rhinitis    Eyes swollen    Lactose intolerance    Urticaria     Past Surgical History:  Procedure Laterality Date   GROWTH PLATE SURGERY Left 2005   elbow   SVT ABLATION N/A 11/26/2020   Procedure: SVT ABLATION;  Surgeon: Lanier Prude, MD;  Location: [facility] INVASIVE CV LAB;  Service: Cardiovascular;  Laterality: N/A;   UPPER GI ENDOSCOPY      Current Medications: Current Meds  Medication Sig   Cholecalciferol (VITAMIN D3) 50 MCG (2000 UT) capsule Take 2,000 Units by mouth daily.   fexofenadine (ALLEGRA) 180 MG tablet Take 1 tablet (180 mg total) by mouth daily as needed for allergies or rhinitis. (Patient taking differently: Take 180 mg by mouth daily.)   fluticasone (FLONASE) 50 MCG/ACT nasal spray 1 spray in each nostril 1-7 times weekly. (Patient taking differently: Place 2 sprays into both nostrils daily as needed for allergies.)   Multiple Vitamin (MULTIVITAMIN) tablet Take 1 tablet by mouth daily.   pimecrolimus (ELIDEL) 1 % cream Apply to face 3-7 times weekly. (Patient taking differently: Apply 1 application topically daily as needed (Rash on face). Apply to face 3-7 times weekly.)   Probiotic Product (ALIGN PO) Take 1 tablet by mouth daily.   valACYclovir (VALTREX) 500 MG tablet TAKE 1 TABLET BY MOUTH 3 TO 7 TIMES PER WEEK (Patient taking differently: Take 500  mg by mouth daily. TAKE 1 TABLET BY MOUTH 3 TO 7 TIMES PER WEEK)   vitamin B-12 (CYANOCOBALAMIN) 1000 MCG tablet Take 1,000 mcg by mouth daily.     Allergies:   Omnicef [cefdinir]   Social History   Socioeconomic History   Marital status: Single    Spouse name: Not on file   Number of children: Not on file   Years of education: Not on file   Highest education level: Not on file  Occupational History   Not on file  Tobacco Use   Smoking status: Never   Smokeless tobacco: Never  Vaping Use   Vaping Use: Never used  Substance and Sexual Activity   Alcohol use: No   Drug use: No   Sexual activity: Not on file  Other Topics Concern   Not on file  Social History Narrative   Not on file   Social Determinants of Health   Financial Resource Strain: Not on file  Food Insecurity: Not on file  Transportation Needs: Not on file  Physical Activity: Not on file  Stress: Not on file  Social Connections: Not on file     Family History: The patient's family history includes Asthma in her mother; Food Allergy in her mother; Hypertension in her father.  ROS:   Please see the history of present illness.    All other  systems reviewed and are negative.  EKGs/Labs/Other Studies Reviewed:    The following studies were reviewed today:  November 26, 2020 EP study and ablation CONCLUSIONS:  1. Sinus rhythm upon presentation.  2. 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 AV conduction appears nodal and excellent. 4. Successful radiofrequency ablation of the slow pathway region 5. No inducible arrhythmias following ablation.  6. No early apparent complications.    EKG:  The ekg ordered today demonstrates sinus rhythm.  No preexcitation.  Intervals normal.  Recent Labs: 11/26/2020: BUN 12; Creatinine, Ser 0.73; Hemoglobin 13.2; Platelets 177; Potassium 3.3; Sodium 135  Recent Lipid Panel No results found for: CHOL, TRIG, HDL, CHOLHDL, VLDL, LDLCALC, LDLDIRECT  Physical Exam:    VS:  BP  110/64   Pulse 81   Ht 5\' 4"  (1.626 m)   Wt 146 lb (66.2 kg)   LMP 11/26/2020 (Exact Date)   BMI 25.06 kg/m     Wt Readings from Last 3 Encounters:  12/22/20 146 lb (66.2 kg) (76 %, Z= 0.70)*  11/26/20 140 lb (63.5 kg) (69 %, Z= 0.49)*  11/20/20 145 lb (65.8 kg) (75 %, Z= 0.67)*   * Growth percentiles are based on CDC (Girls, 2-20 Years) data.     GEN:  Well nourished, well developed in no acute distress HEENT: Normal NECK: No JVD; No carotid bruits LYMPHATICS: No lymphadenopathy CARDIAC: RRR, no murmurs, rubs, gallops RESPIRATORY:  Clear to auscultation without rales, wheezing or rhonchi  ABDOMEN: Soft, non-tender, non-distended MUSCULOSKELETAL:  No edema; No deformity  SKIN: Warm and dry NEUROLOGIC:  Alert and oriented x 3 PSYCHIATRIC:  Normal affect   ASSESSMENT:    1. SVT (supraventricular tachycardia) (HCC)    PLAN:    In order of problems listed above:   #Typical AV nodal reentrant tachycardia Now post successful ablation of the slow pathway on November 26, 2020.  No recurrence of her arrhythmia.  She is back to normal activity levels without recurrence of arrhythmia.  Recommend follow-up as needed.  Did spend time today discussing the COVID-19 vaccine.  She is currently not vaccinated.        Total time spent with patient today 20 minutes. This includes reviewing records, evaluating the patient and coordinating care.   Medication Adjustments/Labs and Tests Ordered: Current medicines are reviewed at length with the patient today.  Concerns regarding medicines are outlined above.  Orders Placed This Encounter  Procedures   EKG 12-Lead   No orders of the defined types were placed in this encounter.    Signed, November 28, 2020, MD, [facility], [facility] 12/22/2020 1:23 PM    Electrophysiology [facility]

## 2020-12-22 NOTE — Patient Instructions (Addendum)
Medication Instructions:  Your physician recommends that you continue on your current medications as directed. Please refer to the Current Medication list given to you today.  Labwork: None ordered.  Testing/Procedures: None ordered.  Follow-Up: Your physician wants you to follow-up in: as needed.   Any Other Special Instructions Will Be Listed Below (If Applicable).  If you need a refill on your cardiac medications before your next appointment, please call your pharmacy.    

## 2021-03-30 ENCOUNTER — Other Ambulatory Visit: Payer: Self-pay

## 2021-03-30 ENCOUNTER — Encounter: Payer: Self-pay | Admitting: Allergy

## 2021-03-30 ENCOUNTER — Ambulatory Visit (INDEPENDENT_AMBULATORY_CARE_PROVIDER_SITE_OTHER): Payer: Medicaid Other | Admitting: Allergy

## 2021-03-30 VITALS — BP 108/62 | HR 83 | Resp 16

## 2021-03-30 DIAGNOSIS — B001 Herpesviral vesicular dermatitis: Secondary | ICD-10-CM | POA: Diagnosis not present

## 2021-03-30 DIAGNOSIS — J3089 Other allergic rhinitis: Secondary | ICD-10-CM

## 2021-03-30 DIAGNOSIS — L989 Disorder of the skin and subcutaneous tissue, unspecified: Secondary | ICD-10-CM | POA: Diagnosis not present

## 2021-03-30 MED ORDER — VALACYCLOVIR HCL 500 MG PO TABS: 500.0000 mg | ORAL_TABLET | Freq: Every day | ORAL | 1 refills | Status: DC

## 2021-03-30 NOTE — Progress Notes (Signed)
Follow-up Note  RE: Sandy Gardner MRN: [id number] DOB: 2000-08-27 Date of Office Visit: 03/30/2021   History of present illness: Sandy Gardner is a 20 y.o. female presenting today for follow-up of allergic rhinitis and inflammatory dermatosis.  She also has history of herpes labialis that she has taken valtrex for.  She was last seen in the office on 04/27/20 by Dr. Lucie Leather.    She states she has been doing good.  She states Allegra helps and will take it couple times a week as her maintenance.  She will use flonase couple a times a week like the Allegra for nasal symptom control.    Her main reason for the visit is to get a refill of Valtrex.  She states she takes valtrex pretty much daily to control her cold sores.  She took her last dose yesterday. She states prior if she didn't take her valtrex for 2-7 days she would have a return of cold sores.  She states has had lesions around her nose and mouth.    She also has elidel for facial dermatitis as needed.  States needed to use it over a month ago for facial rash.    Review of systems in the past 4 weeks: Review of Systems  Constitutional: Negative.   HENT: Negative.    Eyes: Negative.   Respiratory: Negative.    Cardiovascular: Negative.   Gastrointestinal: Negative.   Musculoskeletal: Negative.   Skin: Negative.   Allergic/Immunologic: Negative.   Neurological: Negative.     All other systems negative unless noted above in HPI  Past medical/social/surgical/family history have been reviewed and are unchanged unless specifically indicated below.  No changes  Medication List: Current Outpatient Medications  Medication Sig Dispense Refill   Cholecalciferol (VITAMIN D3) 50 MCG (2000 UT) capsule Take 2,000 Units by mouth daily.     fexofenadine (ALLEGRA) 180 MG tablet Take 1 tablet (180 mg total) by mouth daily as needed for allergies or rhinitis. (Patient taking differently: Take 180 mg by mouth daily.) 31 tablet 11    fluticasone (FLONASE) 50 MCG/ACT nasal spray 1 spray in each nostril 1-7 times weekly. (Patient taking differently: Place 2 sprays into both nostrils daily as needed for allergies.) 16 g 5   Multiple Vitamin (MULTIVITAMIN) tablet Take 1 tablet by mouth daily.     pimecrolimus (ELIDEL) 1 % cream Apply to face 3-7 times weekly. (Patient taking differently: Apply 1 application topically daily as needed (Rash on face). Apply to face 3-7 times weekly.) 60 g 5   Probiotic Product (ALIGN PO) Take 1 tablet by mouth daily.     vitamin B-12 (CYANOCOBALAMIN) 1000 MCG tablet Take 1,000 mcg by mouth daily.     valACYclovir (VALTREX) 500 MG tablet Take 1 tablet (500 mg total) by mouth daily. TAKE 1 TABLET BY MOUTH 3 TO 7 TIMES PER WEEK 90 tablet 1   No current facility-administered medications for this visit.     Known medication allergies: Allergies  Allergen Reactions   Omnicef [Cefdinir] Hives     Physical examination: Blood pressure 108/62, pulse 83, resp. rate 16, SpO2 98 %.  General: Alert, interactive, in no acute distress. HEENT: PERRLA, TMs pearly gray, turbinates non-edematous without discharge, post-pharynx non erythematous. Neck: Supple without lymphadenopathy. Lungs: Clear to auscultation without wheezing, rhonchi or rales. {no increased work of breathing. CV: Normal S1, S2 without murmurs. Abdomen: Nondistended, nontender. Skin: Warm and dry, without lesions or rashes. Extremities:  No clubbing, cyanosis or edema.  Neuro:   Grossly intact.  Diagnositics/Labs: None today  Assessment and plan: Perennial allergic rhinitis Inflammatory dermatosis Herpes labialis  At this time all of her symptoms seem to be controlled with her current regimen.  She has good response with use of her antihistamine and Flonase.  She has suppression of her herpes labialis with Valtrex use.  She also has resolution of dermatitis with Elidel.  We will continue her regimen as below   1. Continue Valtrex  500 one tablet 3-7 times per week  2. Continue Elidel applied to face 1-7 times per week if needed.    3. Continue Flonase 1 spray each nostril 1-7 times per week if needed  4. Continue OTC antihistamine if needed  5. Return to clinic 12 months or earlier if problem  6. Recommend COVID vaccination series and flu vaccine   I appreciate the opportunity to take part in [facility] care. Please do not hesitate to contact me with questions.  Sincerely,   Margo Aye, MD Allergy/Immunology [facility]

## 2021-03-30 NOTE — Patient Instructions (Signed)
  1. Continue Valtrex 500 one tablet 3-7 times per week  2. Continue Elidel applied to face 1-7 times per week if needed.    3. Continue Flonase 1 spray each nostril 1-7 times per week if needed  4. Continue OTC antihistamine if needed  5. Return to clinic 12 months or earlier if problem  6. Recommend COVID vaccination series and flu vaccine

## 2021-08-11 ENCOUNTER — Other Ambulatory Visit: Payer: Self-pay | Admitting: *Deleted

## 2021-08-11 ENCOUNTER — Telehealth: Payer: Self-pay | Admitting: Allergy

## 2021-08-11 MED ORDER — FEXOFENADINE HCL 180 MG PO TABS: ORAL_TABLET | ORAL | 7 refills | Status: DC

## 2021-08-11 NOTE — Telephone Encounter (Signed)
RX sent

## 2021-08-11 NOTE — Telephone Encounter (Signed)
Patient is requesting a refill on Allegra sent to [facility] in [place] on [phone]. ?

## 2021-09-27 ENCOUNTER — Telehealth: Payer: Self-pay | Admitting: Allergy and Immunology

## 2021-09-27 MED ORDER — VALACYCLOVIR HCL 500 MG PO TABS: ORAL_TABLET | ORAL | 3 refills | Status: DC

## 2021-09-27 NOTE — Telephone Encounter (Signed)
Patient is requesting a refill on [facility]altrex [facility] [address].

## 2021-09-27 NOTE — Telephone Encounter (Signed)
[facility] to [facility] on [address].

## 2021-11-08 ENCOUNTER — Encounter: Payer: Self-pay | Admitting: *Deleted

## 2021-11-08 ENCOUNTER — Encounter: Payer: Self-pay | Admitting: Cardiology

## 2021-11-08 ENCOUNTER — Telehealth: Payer: Self-pay | Admitting: Cardiology

## 2021-11-08 ENCOUNTER — Ambulatory Visit (INDEPENDENT_AMBULATORY_CARE_PROVIDER_SITE_OTHER): Payer: Medicaid Other

## 2021-11-08 DIAGNOSIS — I471 Supraventricular tachycardia: Secondary | ICD-10-CM

## 2021-11-08 NOTE — Telephone Encounter (Signed)
Spoke to the patient and her mother today. Per MD Lalla Brothers 14 day Zio to further evaluate the flutters she is feeling. Will follow up based on heart monitor. Verbalized understanding and agreement.

## 2021-11-08 NOTE — Telephone Encounter (Signed)
Patient c/o Palpitations:  High priority if patient c/o lightheadedness, shortness of breath, or chest pain  How long have you had palpitations/irregular HR/ Afib? Are you having the symptoms now? One week and then off one week, etc. Its been going on for about 6 months   Are you currently experiencing lightheadedness, SOB or CP? no  Do you have a history of afib (atrial fibrillation) or irregular heart rhythm? No   Have you checked your BP or HR? (document readings if available):  She states he bp is always low. Hr usually 90-100 and it might be in the 70s  Are you experiencing any other symptoms? Pt mothers states that pt has been experiencing heart flutters. She states that when it happens she just coughs and it will go away. She states pt does not have any other symptoms.

## 2021-11-08 NOTE — Progress Notes (Unsigned)
Enrolled for [employer] to mail a ZIO XT long term holter monitor to the patients address on file.  Letter with instructions mailed to patient. 

## 2021-11-16 DIAGNOSIS — I471 Supraventricular tachycardia: Secondary | ICD-10-CM | POA: Diagnosis not present

## 2022-02-15 ENCOUNTER — Other Ambulatory Visit: Payer: Self-pay | Admitting: *Deleted

## 2022-02-15 MED ORDER — VALACYCLOVIR HCL 500 MG PO TABS: ORAL_TABLET | ORAL | 3 refills | Status: DC

## 2022-02-16 ENCOUNTER — Other Ambulatory Visit: Payer: Self-pay | Admitting: *Deleted

## 2022-02-16 MED ORDER — VALACYCLOVIR HCL 500 MG PO TABS: ORAL_TABLET | ORAL | 3 refills | Status: DC

## 2022-06-06 ENCOUNTER — Ambulatory Visit (INDEPENDENT_AMBULATORY_CARE_PROVIDER_SITE_OTHER): Payer: Medicaid Other | Admitting: Allergy and Immunology

## 2022-06-06 ENCOUNTER — Encounter: Payer: Self-pay | Admitting: Allergy and Immunology

## 2022-06-06 VITALS — BP 100/60 | HR 86 | Resp 18

## 2022-06-06 DIAGNOSIS — L989 Disorder of the skin and subcutaneous tissue, unspecified: Secondary | ICD-10-CM | POA: Diagnosis not present

## 2022-06-06 DIAGNOSIS — J3089 Other allergic rhinitis: Secondary | ICD-10-CM

## 2022-06-06 DIAGNOSIS — B001 Herpesviral vesicular dermatitis: Secondary | ICD-10-CM

## 2022-06-06 MED ORDER — VALACYCLOVIR HCL 1 G PO TABS: ORAL_TABLET | ORAL | 11 refills | Status: DC

## 2022-06-06 NOTE — Progress Notes (Unsigned)
[facility]   Follow-up Note  Referring Provider: Mateo Flow, MD Primary Provider: Mateo Flow, MD Date of Office Visit: 06/06/2022  Subjective:   [facility] (DOB: 31-Dec-2000) is a 22 y.o. female who returns to the [facility] on 06/06/2022 in re-evaluation of the following:  HPI: [facility] returns to this clinic in evaluation of allergic rhinitis, herpes labialis, inflammatory dermatosis.  I last saw her in this clinic 27 April 2020 and she visited with Dr. Nelva Bush on 30 March 2021.  Her herpes labialis affecting her right upper lip and philtrum has become a little bit more active while using Valtrex 500 mg daily.  Certainly these lesions never get as red or as inflamed as they did in the past but they still appear to, Pinnell last about a week.  She usually has a solitary lesion.  Her allergic rhinitis has been under excellent control while intermittently using Flonase and an over-the-counter antihistamine, currently Allegra.  Her facial dermatitis appears to be under very Sandy control while using Elidel about 3 times per week usually to her eyelids and underneath her right lower lip.  She did visit with dermatology and they gave her various creams to use but she has the same result while using Elidel and she is very comfortable with using Elidel a few times a week.  She does not receive the flu vaccine.  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  Allergies as of 06/06/2022       Reactions   Omnicef [cefdinir] Hives        Medication List    ALIGN PO Take 1 tablet by mouth daily.   cyanocobalamin 1000 MCG tablet Commonly known as: VITAMIN B12 Take 1,000 mcg by mouth daily.   fexofenadine 180 MG tablet Commonly known as: ALLEGRA Take one tablet once daily   fluticasone 50 MCG/ACT nasal spray Commonly known as: Flonase 1 spray in  each nostril 1-7 times weekly.   Lo Loestrin Fe 1 MG-10 MCG / 10 MCG tablet Generic drug: Norethindrone-Ethinyl Estradiol-Fe Biphas Take 1 tablet by mouth as directed.   multivitamin tablet Take 1 tablet by mouth daily.   pimecrolimus 1 % cream Commonly known as: ELIDEL Apply to face 3-7 times weekly.   Vitamin D3 50 MCG (2000 UT) capsule Take 2,000 Units by mouth daily.    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   HENT: Negative.    Eyes: Negative.   Respiratory: Negative.    Cardiovascular: Negative.   Gastrointestinal: Negative.   Genitourinary: Negative.   Musculoskeletal: Negative.   Skin: Negative.   Neurological: Negative.   Endo/Heme/Allergies: Negative.   Psychiatric/Behavioral: Negative.       Objective:   Vitals:   06/06/22 1126  BP:  100/60  Pulse: 86  Resp: 18  SpO2: 99%          Physical Exam Constitutional:      Appearance: She is not diaphoretic.  HENT:     Head: Normocephalic.     Right Ear: Tympanic membrane, ear canal and external ear normal.     Left Ear: Tympanic membrane, ear canal and external ear normal.     Nose: Nose normal. No mucosal edema or rhinorrhea.     Mouth/Throat:     Pharynx: Uvula midline. No oropharyngeal exudate.  Eyes:     Conjunctiva/sclera: Conjunctivae normal.  Neck:     Thyroid: No thyromegaly.     Trachea: Trachea normal. No tracheal tenderness or tracheal deviation.  Cardiovascular:      Rate and Rhythm: Normal rate and regular rhythm.     Heart sounds: Normal heart sounds, S1 normal and S2 normal. No murmur heard. Pulmonary:     Effort: No respiratory distress.     Breath sounds: Normal breath sounds. No stridor. No wheezing or rales.  Lymphadenopathy:     Head:     Right side of head: No tonsillar adenopathy.     Left side of head: No tonsillar adenopathy.     Cervical: No cervical adenopathy.  Skin:    Findings: No erythema or rash.     Nails: There is no clubbing.  Neurological:     Mental Status: She is alert.     Diagnostics: none  Assessment and Plan:   1. Perennial allergic rhinitis   2. Inflammatory dermatosis   3. Herpes labialis    1. Increase Valtrex 1000 one tablet 3-7 times per week  2. Continue Elidel applied to face 1-7 times per week if needed.    3. Continue Flonase 1 spray each nostril 1-7 times per week if needed  4. Continue OTC antihistamine if needed  5. Return to clinic 12 months or earlier if problem  Carnell will increase her dose of Valtrex and she will let me know how this approach goes regarding control of her herpes labialis.  She has Sandy control of her inflammatory dermatosis with relatively infrequent use of Elidel and her allergic rhinitis is under Sandy control with her other medications as noted above.  Assuming she does well with this plan I will see her back in this clinic in 1 year or earlier if there is a problem.  Allena Katz, MD Allergy / Immunology [place]

## 2022-06-06 NOTE — Patient Instructions (Signed)
  1. Increase Valtrex 1000 one tablet 3-7 times per week  2. Continue Elidel applied to face 1-7 times per week if needed.    3. Continue Flonase 1 spray each nostril 1-7 times per week if needed  4. Continue OTC antihistamine if needed  5. Return to clinic 12 months or earlier if problem

## 2022-06-07 ENCOUNTER — Encounter: Payer: Self-pay | Admitting: Allergy and Immunology

## 2022-06-30 ENCOUNTER — Telehealth: Payer: Self-pay | Admitting: Allergy and Immunology

## 2022-06-30 NOTE — Telephone Encounter (Signed)
Patient states she was taking her Valtrex everyday but would  cause her bad headaches. She then started to space out when she would take it. She last took her medication on Sunday and has not had a headache or a cold sore pop up. She wants to know since it is a higher dose than usual is she able to wait a couple more days in between?

## 2022-07-01 NOTE — Telephone Encounter (Signed)
I called the patient to go over medication dosing and left a message for her to call the [facility] office back.

## 2022-07-04 NOTE — Telephone Encounter (Signed)
Patient returned call - DOB verified - advised of provider notation below.  Patient verbalized understanding, no further questions.

## 2023-07-06 ENCOUNTER — Emergency Department (HOSPITAL_COMMUNITY)
Admission: EM | Admit: 2023-07-06 | Discharge: 2023-07-06 | Disposition: A | Discharge status: Home / Self Care | Attending: Emergency Medicine | Admitting: Emergency Medicine

## 2023-07-06 ENCOUNTER — Encounter (HOSPITAL_COMMUNITY): Payer: Self-pay

## 2023-07-06 ENCOUNTER — Emergency Department (HOSPITAL_COMMUNITY)

## 2023-07-06 ENCOUNTER — Other Ambulatory Visit: Payer: Self-pay

## 2023-07-06 DIAGNOSIS — Z5321 Procedure and treatment not carried out due to patient leaving prior to being seen by health care provider: Secondary | ICD-10-CM | POA: Insufficient documentation

## 2023-07-06 DIAGNOSIS — R109 Unspecified abdominal pain: Secondary | ICD-10-CM | POA: Insufficient documentation

## 2023-07-06 LAB — HCG, SERUM, QUALITATIVE: Preg, Serum: NEGATIVE

## 2023-07-06 LAB — BASIC METABOLIC PANEL
Anion gap: 11 (ref 5–15)
BUN: 7 mg/dL (ref 6–20)
CO2: 19 mmol/L — ABNORMAL LOW (ref 22–32)
Calcium: 9.6 mg/dL (ref 8.9–10.3)
Chloride: 108 mmol/L (ref 98–111)
Creatinine, Ser: 0.66 mg/dL (ref 0.44–1.00)
GFR, Estimated: >60 mL/min (ref >60)
Glucose, Bld: 84 mg/dL (ref 70–99)
Potassium: 4.3 mmol/L (ref 3.5–5.1)
Sodium: 138 mmol/L (ref 135–145)

## 2023-07-06 LAB — TROPONIN I (HIGH SENSITIVITY)
Troponin I (High Sensitivity): <2 ng/L (ref <18)
Troponin I (High Sensitivity): 3 ng/L (ref <18)

## 2023-07-06 LAB — CBC
HCT: 43.6 % (ref 36.0–46.0)
Hemoglobin: 14.6 g/dL (ref 12.0–15.0)
MCH: 33.7 pg (ref 26.0–34.0)
MCHC: 33.5 g/dL (ref 30.0–36.0)
MCV: 100.7 fL — ABNORMAL HIGH (ref 80.0–100.0)
Platelets: 230 10*3/uL (ref 150–400)
RBC: 4.33 MIL/uL (ref 3.87–5.11)
RDW: 11.9 % (ref 11.5–15.5)
WBC: 10.4 10*3/uL (ref 4.0–10.5)
nRBC: 0 % (ref 0.0–0.2)

## 2023-07-06 LAB — LIPASE, BLOOD: Lipase: 34 U/L (ref 11–51)

## 2023-07-06 NOTE — ED Notes (Signed)
 Pt stated she was felling better. Pt advised she had a bed ready and will be seen shortly however, pt and family choose to leave.

## 2023-07-06 NOTE — ED Triage Notes (Signed)
 Pt reports intermittent "all over" sharp, stabbing abdominal pain that started today around 1230 that she describes as abdominal cramping. She has hx of GI issues and has not seen a GI doctor yet.

## 2023-07-06 NOTE — ED Provider Triage Note (Signed)
 Emergency Medicine Provider Triage Evaluation Note  Sandy [facility] , a 23 y.o. female  was evaluated in triage.  Pt complains of abdominal pain.  Patient reports sharp pains that began around noon today and resolved while in the lobby.  States that she has had a history of abdominal issues in the past for which she supposed to see GI for the near future.  States this is the worst pain that she is ever had.  Denies any associated changes to bowel habits, dysuria, nausea, vomiting, or fevers.  No new foods prior to onset of symptoms.  Review of Systems  Positive: Abdominal pain Negative: Nausea, vomiting, diarrhea, or fevers  Physical Exam  BP 114/71 (BP Location: Right Arm)   Pulse (!) 107   Temp 98.5 F (36.9 C)   Resp 18   Ht 5\' 4"  (1.626 m)   Wt 66.2 kg   LMP 07/06/2022   SpO2 100%   BMI 25.06 kg/m  Gen:   Awake, no distress   Resp:  Normal effort  MSK:   Moves extremities without difficulty  Other:    Medical Decision Making  Medically screening exam initiated at 4:33 PM.  Appropriate orders placed.  KEIONDRA BROOKOVER was informed that the remainder of the evaluation will be completed by another provider, this initial triage assessment does not replace that evaluation, and the importance of remaining in the ED until their evaluation is complete.   Maxwell Marion, PA-C 07/06/23 1635

## 2023-08-22 ENCOUNTER — Telehealth: Payer: Self-pay | Admitting: Allergy and Immunology

## 2023-08-22 NOTE — Telephone Encounter (Signed)
 Patient is requesting a courtesy refill on Valtrex  to [facility] in [place].

## 2023-08-28 ENCOUNTER — Ambulatory Visit: Admitting: Physician Assistant

## 2023-08-29 ENCOUNTER — Other Ambulatory Visit: Payer: Self-pay | Admitting: *Deleted

## 2023-08-29 MED ORDER — VALACYCLOVIR HCL 1 G PO TABS: ORAL_TABLET | ORAL | 0 refills | Status: DC

## 2023-08-29 NOTE — Telephone Encounter (Signed)
 RX sent by [facility].

## 2023-09-07 ENCOUNTER — Encounter: Payer: Self-pay | Admitting: Allergy and Immunology

## 2023-09-07 ENCOUNTER — Ambulatory Visit (INDEPENDENT_AMBULATORY_CARE_PROVIDER_SITE_OTHER): Admitting: Allergy and Immunology

## 2023-09-07 VITALS — BP 102/52 | HR 80 | Resp 16 | Ht 64.4 in | Wt 139.2 lb

## 2023-09-07 DIAGNOSIS — J3089 Other allergic rhinitis: Secondary | ICD-10-CM

## 2023-09-07 DIAGNOSIS — L989 Disorder of the skin and subcutaneous tissue, unspecified: Secondary | ICD-10-CM | POA: Diagnosis not present

## 2023-09-07 DIAGNOSIS — B001 Herpesviral vesicular dermatitis: Secondary | ICD-10-CM | POA: Diagnosis not present

## 2023-09-07 MED ORDER — VALACYCLOVIR HCL 1 G PO TABS: ORAL_TABLET | ORAL | 11 refills | Status: AC

## 2023-09-07 MED ORDER — FEXOFENADINE HCL 180 MG PO TABS: ORAL_TABLET | ORAL | 11 refills | Status: AC

## 2023-09-07 NOTE — Patient Instructions (Signed)
  1. Continue Valtrex  1000 one tablet 3-7 times per week  2. Continue Elidel  applied to face 1-7 times per week if needed.    3. Continue Flonase  1 spray each nostril 1-7 times per week if needed  4. Continue allegra  180 - 1 tablet 1 time per day if needed  5. Return to clinic 12 months or earlier if problem  6. Influenza = Tamiflu. Covid = Paxlovid

## 2023-09-07 NOTE — Progress Notes (Signed)
 [facility] [place] [facility] [place] [facility] [place] - [facility]   Follow-up Note  Referring Provider: Beecher Bower, MD Primary Provider: Beecher Bower, MD Date of Office Visit: 09/07/2023  Subjective:   [facility] (DOB: May 06, 2000) is a 23 y.o. female who [facility] in re-evaluation of the following:  HPI: [name] returns to this clinic in evaluation of allergic rhinitis, herpes labialis, and history of inflammatory dermatosis.  I last saw in this clinic 06 June 2022.  She has done very well with her herpes labialis while consistently using Valtrex .  She alters the dose of Valtrex  between 500 mg/day to 1000 mg/day.  Her facial dermatitis is under excellent control with some occasional Elidel .  She has had very little problems with her upper airways while using some Flonase  and over-the-counter Allegra .  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  Allergies as of 09/07/2023       Reactions   Omnicef [cefdinir] Hives        Medication List    ALIGN PO Take 1 tablet by mouth daily.   famotidine 20 MG tablet Commonly known as: PEPCID Take 20 mg by mouth 2 (two) times daily as needed.   fexofenadine  180 MG tablet Commonly known as: ALLEGRA  Can take one tablet by mouth once daily if needed.   fluticasone  50 MCG/ACT nasal spray Commonly known as: Flonase  1 spray in each nostril 1-7 times weekly.   ketoconazole 2 % shampoo Commonly known as: NIZORAL SMARTSIG:Topical 2-3 Times Weekly   Lo Loestrin Fe 1 MG-10 MCG / 10 MCG tablet Generic drug: Norethindrone-Ethinyl Estradiol-Fe Biphas Take 1 tablet by mouth as directed.   multivitamin tablet Take 1 tablet by mouth daily.   pimecrolimus  1 % cream Commonly known as: ELIDEL  Apply to face 3-7 times weekly.   valACYclovir  1000 MG tablet Commonly known as: Valtrex  Take one tablet three to seven times per week as directed.   Vitamin D3 50 MCG  (2000 UT) capsule Take 2,000 Units by mouth daily.    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   HENT: Negative.    Eyes: Negative.   Respiratory: Negative.    Cardiovascular: Negative.   Gastrointestinal: Negative.   Genitourinary: Negative.   Musculoskeletal: Negative.   Skin: Negative.   Neurological: Negative.   Endo/Heme/Allergies: Negative.   Psychiatric/Behavioral: Negative.       Objective:   Vitals:   09/07/23 1504  BP: (!) 102/52  Pulse: 80  Resp: 16  SpO2: 98%   Height: 5' 4.4" (163.6 cm)  Weight: 139 lb 3.2 oz (63.1 kg)   Physical Exam Constitutional:      Appearance: She is not diaphoretic.  HENT:     Head: Normocephalic.     Right Ear: Tympanic membrane, ear canal and external ear  normal.     Left Ear: Tympanic membrane, ear canal and external ear normal.     Nose: Nose normal. No mucosal edema or rhinorrhea.     Mouth/Throat:     Pharynx: Uvula midline. No oropharyngeal exudate.  Eyes:     Conjunctiva/sclera: Conjunctivae normal.  Neck:     Thyroid: No thyromegaly.     Trachea: Trachea normal. No tracheal tenderness or tracheal deviation.  Cardiovascular:     Rate and Rhythm: Normal rate and regular rhythm.     Heart sounds: Normal heart sounds, S1 normal and S2 normal. No murmur heard. Pulmonary:     Effort: No respiratory distress.     Breath sounds: Normal breath sounds. No stridor. No wheezing or rales.   Lymphadenopathy:     Head:     Right side of head: No tonsillar adenopathy.     Left side of head: No tonsillar adenopathy.     Cervical: No cervical adenopathy.  Skin:    Findings: No erythema or rash.     Nails: There is no clubbing.  Neurological:     Mental Status: She is alert.     Diagnostics: none  Assessment and Plan:   1. Perennial allergic rhinitis   2. Inflammatory dermatosis   3. Herpes labialis    1. Continue Valtrex  1000 one tablet 3-7 times per week  2. Continue Elidel  applied to face 1-7 times per week if needed.    3. Continue Flonase  1 spray each nostril 1-7 times per week if needed  4. Continue allegra  180 - 1 tablet 1 time per day if needed  5. Return to clinic 12 months or earlier if problem  6. Influenza = Tamiflu. Covid = Paxlovid  Hatice appears to be doing quite well on her current plan and she has a very good understanding of her disease states and how her medications work and appropriate dosing of her medications depending on disease activity.  Assuming she does well with the plan noted above I will see her back in this clinic in 1 year or earlier if there is a problem.   Schuyler Custard, MD Allergy / Immunology [facility]

## 2023-09-08 ENCOUNTER — Telehealth: Payer: Self-pay

## 2023-09-08 NOTE — Telephone Encounter (Signed)
*  Asthma/Allergy  Pharmacy Patient Advocate Encounter   Received notification from CoverMyMeds that prior authorization for Fexofenadine  HCl 180MG  tablets  is required/requested.   Insurance verification completed.   The patient is insured through [facility] .   Per test claim: PA required; PA submitted to above mentioned insurance via CoverMyMeds Key/confirmation #/EOC [id number] Status is pending

## 2023-09-08 NOTE — Telephone Encounter (Signed)
 Denied. Per the health plan's preferred drug list, at least 2 preferred drugs must be tried before requesting this drug or tell us  why the member cannot try any preferred alternatives. Please send us  supporting chart notes and lab results. Here is list of preferred alternatives: cetirizine OTC syrup 1mg /52ml, cetirizine Rx syrup, cetirizine tablets OTC, levocetirizine OTC tablet, levocetirizine Rx tablet, loratadine tablet OTC. Note: Some preferred drug(s) may have quantity limits. Refer to the health plan's preferred drug list for additional details.

## 2023-09-11 ENCOUNTER — Encounter: Payer: Self-pay | Admitting: Allergy and Immunology

## 2023-09-11 NOTE — Telephone Encounter (Signed)
 Left voicemail for patient to return call.

## 2023-09-21 NOTE — Telephone Encounter (Signed)
 My Chart message sent

## 2023-10-18 ENCOUNTER — Ambulatory Visit: Admitting: Physician Assistant

## 2023-11-28 ENCOUNTER — Encounter: Payer: Self-pay | Admitting: Gastroenterology

## 2023-11-28 ENCOUNTER — Ambulatory Visit (INDEPENDENT_AMBULATORY_CARE_PROVIDER_SITE_OTHER): Admitting: Gastroenterology

## 2023-11-28 ENCOUNTER — Other Ambulatory Visit (INDEPENDENT_AMBULATORY_CARE_PROVIDER_SITE_OTHER)

## 2023-11-28 VITALS — BP 104/70 | HR 68 | Ht 64.4 in | Wt 141.0 lb

## 2023-11-28 DIAGNOSIS — R110 Nausea: Secondary | ICD-10-CM | POA: Diagnosis not present

## 2023-11-28 DIAGNOSIS — R140 Abdominal distension (gaseous): Secondary | ICD-10-CM | POA: Diagnosis not present

## 2023-11-28 DIAGNOSIS — R195 Other fecal abnormalities: Secondary | ICD-10-CM | POA: Diagnosis not present

## 2023-11-28 DIAGNOSIS — R1084 Generalized abdominal pain: Secondary | ICD-10-CM

## 2023-11-28 NOTE — Progress Notes (Signed)
 Chief Complaint: Abdominal pain, nausea   Referring Provider:     Fernand Tracey LABOR, MD   HPI:     Sandy [facility] is a 23 y.o. female with a history of allergic rhinitis, inflammatory dermatosis, referred to the [facility] for evaluation of abdominal pain and nausea.  She reports that she has always had stomach problems.  Was diagnosed with lactose intolerance in childhood and has avoided lactose containing foods since then.  Has had worsening abdominal symptoms over the last 6 months or so.  Reports nausea every morning after eating breakfast.  Also with abdominal bloating and visible abdominal distention where she feels like a rock in her abdomen.  2 separate episodes of severe pain across upper abdomen, described as a cramping pain lasting a few seconds but repeating over several hours.  This happened once in March and she was seen in the ER with essentially normal labs and discharged home.  Had another episode last week.  Separately, sometimes has throat noises.  Not heartburn or regurgitation per se. Uses Tums and famotidine at times.   2 weeks ago had 2-3 episodes of black stools. No BRBPR. No diarrhea, constipation.   Reports celiac serologies by PCP at some point in the past with abnormal results.  Results not available for review.  Reviewed labs from 06/2023 ER visit: Normal CBC (MCV 100.7), normal BMP (CO2 19), normal lipase.  No recent abdominal imaging for review.  No previous EGD or colonoscopy.  No known family history of CRC, GI malignancy, liver disease, pancreatic disease, or IBD.     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   Lactose intolerance    Urticaria      Past Surgical History:  Procedure Laterality Date   GROWTH PLATE SURGERY Left 2005   elbow   SVT ABLATION N/A 11/26/2020   Procedure: SVT ABLATION;  Surgeon: Cindie Ole DASEN, MD;  [facility];  Service: Cardiovascular;  Laterality: N/A;   UPPER GI ENDOSCOPY     Family History  Problem Relation Age of Onset   Asthma Mother    Food Allergy Mother        peanut   Hypertension Father    Social History   Tobacco Use   Smoking status: Never   Smokeless tobacco: Never  Vaping Use   Vaping status: Never Used  Substance Use Topics   Alcohol use: No   Drug use: No   Current Outpatient Medications  Medication Sig Dispense Refill   Cholecalciferol (VITAMIN D3) 50 MCG (2000 UT) capsule Take 2,000 Units by mouth daily.     famotidine (PEPCID) 20 MG tablet Take 20 mg by mouth 2 (two) times daily as needed.     fexofenadine  (ALLEGRA ) 180 MG tablet Can take one tablet by mouth once daily if needed. 30 tablet 11   fluticasone  (FLONASE ) 50 MCG/ACT nasal spray 1 spray in each nostril 1-7 times weekly. (Patient taking differently: Place 2 sprays into both nostrils daily as needed for allergies.) 16 g 5   ketoconazole (NIZORAL) 2 % shampoo SMARTSIG:Topical 2-3 Times Weekly     LO LOESTRIN FE 1 MG-10 MCG / 10 MCG tablet Take 1 tablet by mouth as directed.     Multiple Vitamin (MULTIVITAMIN) tablet Take 1 tablet by mouth daily.  pimecrolimus  (ELIDEL ) 1 % cream Apply to face 3-7 times weekly. (Patient taking differently: Apply 1 application  topically daily as needed (Rash on face). Apply to face 3-7 times weekly.) 60 g 5   Probiotic Product (ALIGN PO) Take 1 tablet by mouth daily.     valACYclovir  (VALTREX ) 1000 MG tablet Take one tablet three to seven times per week as directed. 30 tablet 11   No current facility-administered medications for this visit.   Allergies  Allergen Reactions   Omnicef [Cefdinir] Hives     Review of Systems: All systems reviewed and negative except where noted in HPI.     Physical Exam:    Wt Readings from Last 3 Encounters:  11/28/23 141 lb (64 kg)  09/07/23 139 lb 3.2 oz (63.1 kg)  07/06/23 146 lb  (66.2 kg)    BP 104/70   Pulse 68   Ht 5' 4.4 (1.636 m)   Wt 141 lb (64 kg)   BMI 23.90 kg/m  Constitutional:  Pleasant, in no acute distress. Psychiatric: Normal mood and affect. Behavior is normal. Cardiovascular: Normal rate, regular rhythm. No edema Pulmonary/chest: Effort normal and breath sounds normal. No wheezing, rales or rhonchi. Abdominal: Soft, nondistended, nontender. Bowel sounds active throughout. There are no masses palpable. No hepatomegaly. Neurological: Alert and oriented to person place and time. Skin: Skin is warm and dry. No rashes noted.   ASSESSMENT AND PLAN;   1) Generalized abdominal pain 2) Abdominal bloating 3) Nausea without vomiting 4) Dark stools  - Check celiac serologies - SIBO breath testing - EGD to evaluate for mucous/luminal pathology with gastric and duodenal biopsies as appropriate - She already does a low FODMAP diet and avoids known exacerbating foods - If above workup unrevealing, may consider cross-sectional imaging and additional labs  The indications, risks, and benefits of EGD were explained to the patient in detail. Risks include but are not limited to bleeding, perforation, adverse reaction to medications, and cardiopulmonary compromise. Sequelae include but are not limited to the possibility of surgery, hospitalization, and mortality. The patient verbalized understanding and wished to proceed. All questions answered, referred to scheduler. Further recommendations pending results of the exam.     Sandor LULLA Flatter, DO, FACG  11/28/2023, 9:26 AM   Fernand Tracey LABOR, MD

## 2023-11-28 NOTE — Patient Instructions (Signed)
 _______________________________________________________  If your blood pressure at your visit was 140/90 or greater, please contact your primary care physician to follow up on this.  _______________________________________________________  If you are age 23 or older, your body mass index should be between 23-30. Your Body mass index is 23.9 kg/m. If this is out of the aforementioned range listed, please consider follow up with your Primary Care Provider.  If you are age 72 or younger, your body mass index should be between 19-25. Your Body mass index is 23.9 kg/m. If this is out of the aformentioned range listed, please consider follow up with your Primary Care Provider.   ________________________________________________________  The [facility] providers would like to encourage you to use MYCHART to communicate with providers for non-urgent requests or questions.  Due to long hold times on the telephone, sending your provider a message by [facility] may be a faster and more efficient way to get a response.  Please allow 48 business hours for a response.  Please remember that this is for non-urgent requests.  _______________________________________________________  [facility] is using a team-based approach to care.  Your team is made up of your doctor and two to three APPS. Our APPS (Nurse Practitioners and Physician Assistants) work with your physician to ensure care continuity for you. They are fully qualified to address your health concerns and develop a treatment plan. They communicate directly with your gastroenterologist to care for you. Seeing the Advanced Practice Practitioners on your physician's team can help you by facilitating care more promptly, often allowing for earlier appointments, access to diagnostic testing, procedures, and other specialty referrals.   Your provider has requested that you go to the basement level for lab work before leaving today. Press B on the  elevator. The lab is located at the first door on the left as you exit the elevator.  You have been scheduled for an endoscopy. Please follow written instructions given to you at your visit today.  If you use inhalers (even only as needed), please bring them with you on the day of your procedure.  If you take any of the following medications, they will need to be adjusted prior to your procedure:   DO NOT TAKE 7 DAYS PRIOR TO TEST- Trulicity (dulaglutide) Ozempic, Wegovy (semaglutide) Mounjaro (tirzepatide) Bydureon Bcise (exanatide extended release)  DO NOT TAKE 1 DAY PRIOR TO YOUR TEST Rybelsus (semaglutide) Adlyxin (lixisenatide) Victoza (liraglutide) Byetta (exanatide) ___________________________________________________________________________  You have been given a testing kit to check for small intestine bacterial overgrowth (SIBO) which is completed by a company named [facility]. Make sure to return your test in the mail using the return mailing label given to you along with the kit. The test order, your demographic and insurance information have all already been sent to the company. [facility] will collect an upfront charge of $109.00 for commercial insurance plans and $229.00 if you are paying cash. The potential remaining total after claim submission and review is $120.00. Make sure to discuss with [facility] PRIOR to having the test to see if they have gotten information from your insurance company as to how much your testing will cost out of pocket, if any. Please contact [facility] at phone number [phone] to get instructions regarding how to perform the test as our office is unable to give specific testing instructions.   Due to recent changes in healthcare laws, you may see the results of your imaging and laboratory studies on MyChart before your provider has had a chance to review  them.  We understand that in some cases there may be results that are  confusing or concerning to you. Not all laboratory results come back in the same time frame and the provider may be waiting for multiple results in order to interpret others.  Please give us  48 hours in order for your provider to thoroughly review all the results before contacting the office for clarification of your results.   It was a pleasure to see you today!  Vito Cirigliano, D.O.

## 2023-11-29 LAB — TISSUE TRANSGLUTAMINASE, IGA: (tTG) Ab, IgA: <1 U/mL

## 2023-11-29 LAB — IGA: Immunoglobulin A: 186 mg/dL (ref 47–310)

## 2023-12-01 ENCOUNTER — Ambulatory Visit: Payer: Self-pay | Admitting: Gastroenterology

## 2024-01-01 ENCOUNTER — Encounter: Admitting: Gastroenterology

## 2024-01-19 ENCOUNTER — Ambulatory Visit (AMBULATORY_SURGERY_CENTER): Admitting: Gastroenterology

## 2024-01-19 ENCOUNTER — Encounter: Payer: Self-pay | Admitting: Gastroenterology

## 2024-01-19 VITALS — BP 107/66 | HR 78 | Temp 98.2°F | Resp 14 | Ht 64.4 in | Wt 141.0 lb

## 2024-01-19 DIAGNOSIS — R140 Abdominal distension (gaseous): Secondary | ICD-10-CM | POA: Diagnosis not present

## 2024-01-19 DIAGNOSIS — R1084 Generalized abdominal pain: Secondary | ICD-10-CM | POA: Diagnosis not present

## 2024-01-19 DIAGNOSIS — R110 Nausea: Secondary | ICD-10-CM

## 2024-01-19 DIAGNOSIS — K921 Melena: Secondary | ICD-10-CM

## 2024-01-19 DIAGNOSIS — R195 Other fecal abnormalities: Secondary | ICD-10-CM

## 2024-01-19 MED ORDER — SODIUM CHLORIDE 0.9 % IV SOLN: 500.0000 mL | Freq: Once | INTRAVENOUS | Status: AC

## 2024-01-19 NOTE — Progress Notes (Signed)
 Called to room to assist during endoscopic procedure.  Patient ID and intended procedure confirmed with present staff. Received instructions for my participation in the procedure from the performing physician.

## 2024-01-19 NOTE — Progress Notes (Signed)
 GASTROENTEROLOGY PROCEDURE H&P NOTE   Primary Care Physician: Fernand Tracey LABOR, MD    Reason for Procedure:   Abdominal pain, nausea, bloating, abdominal distension, black stool  Plan:    EGD  Patient is appropriate for endoscopic procedure(s) in the ambulatory ([facility]) setting.  The nature of the procedure, as well as the risks, benefits, and alternatives were carefully and thoroughly reviewed with the patient. Ample time for discussion and questions allowed. The patient understood, was satisfied, and agreed to proceed.     HPI: Sandy Gardner is a 23 y.o. female who presents for EGD for evaluation of multiple upper GI sxs to include generalized abdominal pain, cramping, bloating, abdominal distension, nausea, and a few episodes of black stool. Otherwise, no significant changes in clinical hx since last OV on 11/28/2023.   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 Authorizing Provider  Cholecalciferol (VITAMIN D3) 50 MCG (2000 UT) capsule Take 2,000 Units by mouth daily.   Yes [provider]  famotidine (PEPCID) 20 MG tablet Take 20 mg by mouth 2 (two) times daily as needed. 08/25/23  Yes [provider]  fexofenadine  (ALLEGRA ) 180 MG tablet Can take one tablet by mouth once daily if needed. 09/07/23  Yes Kozlow, Camellia PARAS, MD  fluticasone  (FLONASE ) 50 MCG/ACT nasal spray 1 spray in each nostril 1-7 times weekly. 04/27/20  Yes Kozlow, Eric J, MD  ketoconazole (NIZORAL) 2 % shampoo  SMARTSIG:Topical 2-3 Times Weekly 06/26/23  Yes [provider]  LO LOESTRIN FE 1 MG-10 MCG / 10 MCG tablet Take 1 tablet by mouth as directed.   Yes [provider]  Multiple Vitamin (MULTIVITAMIN) tablet Take 1 tablet by mouth daily.   Yes [provider]  Probiotic Product (ALIGN PO) Take 1 tablet by mouth daily.   Yes [provider]  valACYclovir  (VALTREX ) 1000 MG tablet Take one tablet three to seven times per week as directed. 09/07/23  Yes Kozlow, Camellia PARAS, MD  pimecrolimus  (ELIDEL ) 1 % cream Apply to face 3-7 times weekly. Patient not taking: Reported on 01/19/2024 04/27/20   Kozlow, Eric J, MD    Current Outpatient Medications  Medication Sig Dispense Refill   Cholecalciferol (VITAMIN D3) 50 MCG (2000 UT) capsule Take 2,000 Units by mouth daily.     famotidine (PEPCID) 20 MG tablet Take 20 mg by mouth 2 (two) times daily as needed.     fexofenadine  (ALLEGRA ) 180 MG tablet Can take one tablet by mouth once daily if needed. 30 tablet 11   fluticasone  (FLONASE ) 50 MCG/ACT nasal spray 1 spray in each nostril 1-7 times weekly. 16 g 5   ketoconazole (NIZORAL) 2 % shampoo SMARTSIG:Topical 2-3 Times Weekly     LO LOESTRIN FE 1 MG-10 MCG / 10 MCG tablet Take 1 tablet by mouth as directed.     Multiple Vitamin (MULTIVITAMIN) tablet Take 1 tablet by mouth daily.  Probiotic Product (ALIGN PO) Take 1 tablet by mouth daily.     valACYclovir  (VALTREX ) 1000 MG tablet Take one tablet three to seven times per week as directed. 30 tablet 11   pimecrolimus  (ELIDEL ) 1 % cream Apply to face 3-7 times weekly. (Patient not taking: Reported on 01/19/2024) 60 g 5   Current Facility-Administered Medications  Medication Dose Route Frequency Provider Last Rate Last Admin   0.9 %  sodium chloride  infusion  500 mL Intravenous Once Dijon Cosens V, DO        Allergies as of 01/19/2024 - Review Complete 01/19/2024  Allergen Reaction Noted   Omnicef [cefdinir] Hives 10/21/2016     Family History  Problem Relation Age of Onset   Asthma Mother    Food Allergy Mother        peanut   Hypertension Father    Colon cancer Neg Hx    Esophageal cancer Neg Hx    Stomach cancer Neg Hx    Rectal cancer Neg Hx     Social History   Socioeconomic History   Marital status: Single    Spouse name: Not on file   Number of children: Not on file   Years of education: Not on file   Highest education level: Not on file  Occupational History   Occupation: internship  Tobacco Use   Smoking status: Never   Smokeless tobacco: Never  Vaping Use   Vaping status: Never Used  Substance and Sexual Activity   Alcohol use: No   Drug use: No   Sexual activity: Not on file  Other Topics Concern   Not on file  Social History Narrative   Not on file   Social Drivers of Health   Financial Resource Strain: Not on file  Food Insecurity: Not on file  Transportation Needs: Not on file  Physical Activity: Not on file  Stress: Not on file  Social Connections: Not on file  Intimate Partner Violence: Not on file    Physical Exam: Vital signs in last 24 hours: @BP  (!) 102/59   Pulse 84   Temp 98.2 F (36.8 C) (Skin)   Resp (!) 100   Ht 5' 4.4 (1.636 m)   Wt 141 lb (64 kg)   BMI 23.90 kg/m  GEN: NAD EYE: Sclerae anicteric ENT: MMM CV: Non-tachycardic Pulm: CTA b/l GI: Soft, NT/ND NEURO:  Alert & Oriented x 3   Sandor Flatter, DO [facility]   01/19/2024 9:54 AM

## 2024-01-19 NOTE — Patient Instructions (Signed)
 YOU HAD AN ENDOSCOPIC PROCEDURE TODAY AT THE [facility]:   Refer to the procedure report that was given to you for any specific questions about what was found during the examination.  If the procedure report does not answer your questions, please call your gastroenterologist to clarify.  If you requested that your care partner not be given the details of your procedure findings, then the procedure report has been included in a sealed envelope for you to review at your convenience later.  YOU SHOULD EXPECT: Some feelings of bloating in the abdomen. Passage of more gas than usual.  Walking can help get rid of the air that was put into your GI tract during the procedure and reduce the bloating. If you had a lower endoscopy (such as a colonoscopy or flexible sigmoidoscopy) you may notice spotting of blood in your stool or on the toilet paper. If you underwent a bowel prep for your procedure, you may not have a normal bowel movement for a few days.  Please Note:  You might notice some irritation and congestion in your nose or some drainage.  This is from the oxygen used during your procedure.  There is no need for concern and it should clear up in a day or so.  SYMPTOMS TO REPORT IMMEDIATELY:   Following upper endoscopy (EGD)  Vomiting of blood or coffee ground material  New chest pain or pain under the shoulder blades  Painful or persistently difficult swallowing  New shortness of breath  Fever of 100F or higher  Black, tarry-looking stools  For urgent or emergent issues, a gastroenterologist can be reached at any hour by calling [phone]. Do not use MyChart messaging for urgent concerns.    DIET:  We do recommend a small meal at first, but then you may proceed to your regular diet.  Drink plenty of fluids but you should avoid alcoholic beverages for 24 hours.  MEDICATIONS: Continue present medications.  FOLLOW UP: Await pathology results. Proceed with SIBO breath testing.  Return to GI clinic as needed.  Thank you for allowing us  to provide for your healthcare needs today.  ACTIVITY:  You should plan to take it easy for the rest of today and you should NOT DRIVE or use heavy machinery until tomorrow (because of the sedation medicines used during the test).    FOLLOW UP: Our staff will call the number listed on your records the next business day following your procedure.  We will call around 7:15- 8:00 am to check on you and address any questions or concerns that you may have regarding the information given to you following your procedure. If we do not reach you, we will leave a message.     If any biopsies were taken you will be contacted by phone or by letter within the next 1-3 weeks.  Please call us  at [phone] if you have not heard about the biopsies in 3 weeks.    SIGNATURES/CONFIDENTIALITY: You and/or your care partner have signed paperwork which will be entered into your electronic medical record.  These signatures attest to the fact that that the information above on your After Visit Summary has been reviewed and is understood.  Full responsibility of the confidentiality of this discharge information lies with you and/or your care-partner.

## 2024-01-19 NOTE — Progress Notes (Signed)
0957 Robinul 0.1 mg IV given due large amount of secretions upon assessment.  MD made aware, vss

## 2024-01-19 NOTE — Op Note (Signed)
 [facility] Patient Name: Sandy Gardner Procedure Date: 01/19/2024 9:56 AM MRN: [id number] Endoscopist: Sandor Flatter , MD, [id number] Age: 23 Referring MD:  Date of Birth: 2000-10-31 Gender: Female Account #: [id number] Procedure:                Upper GI endoscopy Indications:              Generalized abdominal pain, Abdominal distention,                            Abdominal bloating, Chest pressure, Episodic dark                            stools Medicines:                Monitored Anesthesia Care Procedure:                Pre-Anesthesia Assessment:                           - Prior to the procedure, a History and Physical                            was performed, and patient medications and                            allergies were reviewed. The patient's tolerance of                            previous anesthesia was also reviewed. The risks                            and benefits of the procedure and the sedation                            options and risks were discussed with the patient.                            All questions were answered, and informed consent                            was obtained. Prior Anticoagulants: The patient has                            taken no anticoagulant or antiplatelet agents. ASA                            Grade Assessment: II - A patient with mild systemic                            disease. After reviewing the risks and benefits,                            the patient was deemed in satisfactory condition to  undergo the procedure.                           After obtaining informed consent, the endoscope was                            passed under direct vision. Throughout the                            procedure, the patient's blood pressure, pulse, and                            oxygen saturations were monitored continuously. The                            Olympus scope [id number] was introduced through  the                            mouth, and advanced to the second part of duodenum.                            The upper GI endoscopy was accomplished without                            difficulty. The patient tolerated the procedure                            well. Scope In: Scope Out: Findings:                 The examined esophagus was normal.                           The Z-line was regular and was found 38 cm from the                            incisors.                           The entire examined stomach was normal. Biopsies                            were taken with a cold forceps for Helicobacter                            pylori testing. Estimated blood loss was minimal.                            The pylorus was patent and easily traversed.                           The examined duodenum was normal. Biopsies were                            taken with a cold forceps for histology. Estimated  blood loss was minimal. Complications:            No immediate complications. Estimated Blood Loss:     Estimated blood loss was minimal. Impression:               - Normal esophagus.                           - Z-line regular, 38 cm from the incisors.                           - Normal stomach. Biopsied.                           - Normal examined duodenum. Biopsied. Recommendation:           - Patient has a contact number available for                            emergencies. The signs and symptoms of potential                            delayed complications were discussed with the                            patient. Return to normal activities tomorrow.                            Written discharge instructions were provided to the                            patient.                           - Resume previous diet.                           - Continue present medications.                           - Await pathology results.                           - Proceed  with SIBO breath testing.                           - Return to GI clinic PRN. Sandor Flatter, MD 01/19/2024 10:27:10 AM

## 2024-01-19 NOTE — Progress Notes (Signed)
 Report given to PACU, vss

## 2024-01-22 ENCOUNTER — Telehealth: Payer: Self-pay | Admitting: *Deleted

## 2024-01-22 NOTE — Telephone Encounter (Signed)
 No answer post call. Left a message.

## 2024-01-24 LAB — SURGICAL PATHOLOGY

## 2024-01-31 ENCOUNTER — Ambulatory Visit: Payer: Self-pay | Admitting: Gastroenterology

## 2024-02-08 ENCOUNTER — Telehealth: Payer: Self-pay | Admitting: Gastroenterology

## 2024-02-08 NOTE — Telephone Encounter (Signed)
 Left message for Arley with [facility] to call back.

## 2024-02-08 NOTE — Telephone Encounter (Signed)
 Inbound call from [facility] stating sibo came out negative and would like to speak to nurse in regards to some additional questions on patients results. Good call back number is [phone] Please advise  Thank you

## 2024-02-09 NOTE — Telephone Encounter (Signed)
 Per Arley with [facility] We have confirmed the true negative result for SIBO.You may want to let the clinical team know that this may be a large bowel issue given the lack of gas in the third hour of the breath collections. We should see a gas presence in the third hour and we are not. Why is there not bacteria in the colon fermenting the lactulose?  They might take that into consideration with regard to the next steps for this patient.

## 2024-02-09 NOTE — Telephone Encounter (Addendum)
 Spoke with Arley with [facility], patient's SIBO test appears to be negative, but wants to make sure it is a true negative. Gas did not rise at the end of the test. He is going to email a list of questions to ask the patient to confirm testing is accurate.

## 2024-02-09 NOTE — Telephone Encounter (Signed)
 Called & went over all questions with patient. Email sent back to [facility] with responses & results will be faxed to Dr. San to review.

## 2024-02-15 ENCOUNTER — Other Ambulatory Visit: Payer: Self-pay

## 2024-02-15 DIAGNOSIS — R110 Nausea: Secondary | ICD-10-CM

## 2024-02-15 DIAGNOSIS — R195 Other fecal abnormalities: Secondary | ICD-10-CM

## 2024-02-15 DIAGNOSIS — R1084 Generalized abdominal pain: Secondary | ICD-10-CM

## 2024-02-15 DIAGNOSIS — R140 Abdominal distension (gaseous): Secondary | ICD-10-CM

## 2024-02-15 NOTE — Telephone Encounter (Signed)
 Discussed MD recommendations with patient. We do not currently have any xifaxan samples. Pt would like to be scheduled for CT. Scheduled CT for 03/04/24 at 8:30 am at [facility], arrive at 8:15 am. F/U scheduled for 12/16 at 10:40 am with Dr. San.

## 2024-03-04 ENCOUNTER — Ambulatory Visit (HOSPITAL_COMMUNITY)

## 2024-03-11 ENCOUNTER — Ambulatory Visit (HOSPITAL_COMMUNITY)

## 2024-03-27 ENCOUNTER — Telehealth: Payer: Self-pay

## 2024-03-27 NOTE — Telephone Encounter (Signed)
 Spoke to a representative through the pt healthcare plan.  Zania K stated that there was a PA started prior that got denied.  An appeal was faxed to [facility] provider services for the PA for the pt CT scan that was denied prior.  Fax was sent to [phone] along with pt records and documentation stating the necessity for the CT scan.

## 2024-04-01 NOTE — Telephone Encounter (Signed)
 Follow up was made from the appeal that was faxed last week to pt well care provider service. Fax was sent to [phone].  Spoke to stephanie ( phone # [phone]) Corean stated that typically it can take the appeals up to 30 days. Authorization  number provided is [id number]  Case ID or PA number is [id number]  Reference number [id number]  Pt is scheduled for tomorrow. Radiology was called and CT was canceled for tomorrow.  I will follow up on Wednesday to check on the status of the appeal.   Left message for pt to call back

## 2024-04-02 ENCOUNTER — Observation Stay (HOSPITAL_COMMUNITY)

## 2024-04-09 ENCOUNTER — Ambulatory Visit: Admitting: Gastroenterology

## 2024-04-23 NOTE — Telephone Encounter (Signed)
 Follow up phone call to phone # [phone]) was made to check on the status of the appeal.  Spoke with Sonya. Grayce stated that the appeal was made on 03/28/2024. Grayce stated that there was no update on the appeal yet and it can take up to 30 days.  Batch number provided was [id number]  Ticket number [id number]

## 2024-05-08 ENCOUNTER — Telehealth: Payer: Self-pay | Admitting: Gastroenterology

## 2024-05-08 ENCOUNTER — Telehealth: Payer: Self-pay

## 2024-05-08 NOTE — Telephone Encounter (Signed)
*  AA  Pharmacy Patient Advocate Encounter   Received notification from Fax that prior authorization for Fexofenadine  HCl 180MG  tablets   is required/requested.   Insurance verification completed.   The patient is insured through [facility] MEDICAID.   Previous PA denied, patient was notified of OTC availability.   Key: [id number]

## 2024-05-08 NOTE — Telephone Encounter (Signed)
 Spoke with patient.  She continues to have issues.  inability to belch followed by episodes of extreme bloating and painful pressure.  Yesterday she experienced  an episode that lasted 2 hours.   After  doing some research she has discovered  [employer]-- she states that she has all the symptoms and wanted to ask your advice.  If you think she could have this, what would be next steps.  Thanks

## 2024-05-08 NOTE — Telephone Encounter (Signed)
 Inbound call from patient stating she would like to speak to nurse due to recently experiencing heart burn and doing online research and feels like theres something she may have. Requesting a call back  Please advise  Thank you

## 2024-05-24 NOTE — Telephone Encounter (Signed)
 Called [employer] to check status. Denied since November. Agent I spoke with advised that the original denial has to be appealed  First step to contact [employer]) @ [phone] to request exact information needed to make appeal.  Fax supporting documents to [phone] ([employer]) Auth# [id number] Ref# for phone call with [facility] [phone]

## 2024-05-29 NOTE — Telephone Encounter (Signed)
 Please see notes below and assist with the PA

## 2024-05-29 NOTE — Telephone Encounter (Signed)
 Dr San,  This CT scan was ordered in October and has apparently been denied.  Looks like maybe an appeal was attempted/started, but there is no approval been made.    Can you please advise what steps should be taken at this point and route response to nurse team.  Thank you
